# Patient Record
Sex: Female | Born: 1955
Health system: Southern US, Community
[De-identification: ages and names within clinical notes are randomized; demographics above are authoritative.]

## PROBLEM LIST (undated history)

## (undated) DIAGNOSIS — M41129 Adolescent idiopathic scoliosis, site unspecified: Secondary | ICD-10-CM

## (undated) DIAGNOSIS — Z808 Family history of malignant neoplasm of other organs or systems: Secondary | ICD-10-CM

## (undated) DIAGNOSIS — E0590 Thyrotoxicosis, unspecified without thyrotoxic crisis or storm: Secondary | ICD-10-CM

## (undated) DIAGNOSIS — Z9221 Personal history of antineoplastic chemotherapy: Secondary | ICD-10-CM

## (undated) DIAGNOSIS — I10 Essential (primary) hypertension: Secondary | ICD-10-CM

## (undated) DIAGNOSIS — L570 Actinic keratosis: Secondary | ICD-10-CM

## (undated) DIAGNOSIS — D649 Anemia, unspecified: Secondary | ICD-10-CM

## (undated) DIAGNOSIS — Z923 Personal history of irradiation: Secondary | ICD-10-CM

## (undated) DIAGNOSIS — E785 Hyperlipidemia, unspecified: Secondary | ICD-10-CM

## (undated) DIAGNOSIS — Z8051 Family history of malignant neoplasm of kidney: Secondary | ICD-10-CM

## (undated) DIAGNOSIS — E538 Deficiency of other specified B group vitamins: Secondary | ICD-10-CM

## (undated) DIAGNOSIS — C50919 Malignant neoplasm of unspecified site of unspecified female breast: Secondary | ICD-10-CM

## (undated) HISTORY — DX: Actinic keratosis: L57.0

## (undated) HISTORY — DX: Family history of malignant neoplasm of other organs or systems: Z80.8

## (undated) HISTORY — PX: APPENDECTOMY: SHX54

## (undated) HISTORY — PX: OTHER SURGICAL HISTORY: SHX169

## (undated) HISTORY — DX: Family history of malignant neoplasm of kidney: Z80.51

## (undated) HISTORY — PX: BREAST CYST ASPIRATION: SHX578

---

## 2005-01-07 ENCOUNTER — Ambulatory Visit: Payer: Self-pay | Admitting: Unknown Physician Specialty

## 2005-01-15 ENCOUNTER — Ambulatory Visit: Payer: Self-pay | Admitting: Gastroenterology

## 2006-01-12 ENCOUNTER — Ambulatory Visit: Payer: Self-pay | Admitting: Unknown Physician Specialty

## 2007-01-18 ENCOUNTER — Ambulatory Visit: Payer: Self-pay | Admitting: Unknown Physician Specialty

## 2008-02-14 ENCOUNTER — Ambulatory Visit: Payer: Self-pay | Admitting: Unknown Physician Specialty

## 2008-04-19 ENCOUNTER — Ambulatory Visit: Payer: Self-pay | Admitting: Gastroenterology

## 2009-02-17 ENCOUNTER — Ambulatory Visit: Payer: Self-pay | Admitting: Unknown Physician Specialty

## 2009-09-13 DIAGNOSIS — C50919 Malignant neoplasm of unspecified site of unspecified female breast: Secondary | ICD-10-CM

## 2009-09-13 HISTORY — PX: BREAST BIOPSY: SHX20

## 2009-09-13 HISTORY — PX: BREAST LUMPECTOMY: SHX2

## 2009-09-13 HISTORY — DX: Malignant neoplasm of unspecified site of unspecified female breast: C50.919

## 2010-02-18 ENCOUNTER — Ambulatory Visit: Payer: Self-pay | Admitting: Unknown Physician Specialty

## 2010-06-09 ENCOUNTER — Ambulatory Visit: Payer: Self-pay | Admitting: Unknown Physician Specialty

## 2010-07-07 ENCOUNTER — Ambulatory Visit: Payer: Self-pay | Admitting: Surgery

## 2010-07-15 ENCOUNTER — Ambulatory Visit: Payer: Self-pay | Admitting: Surgery

## 2010-07-29 ENCOUNTER — Ambulatory Visit: Payer: Self-pay | Admitting: Internal Medicine

## 2010-07-29 LAB — PATHOLOGY REPORT

## 2010-08-05 ENCOUNTER — Ambulatory Visit: Payer: Self-pay | Admitting: Internal Medicine

## 2010-08-07 LAB — CANCER ANTIGEN 27.29: CA 27.29: 25.9 U/mL (ref 0.0–38.6)

## 2010-08-11 ENCOUNTER — Ambulatory Visit: Payer: Self-pay | Admitting: Internal Medicine

## 2010-08-13 ENCOUNTER — Ambulatory Visit: Payer: Self-pay | Admitting: Internal Medicine

## 2010-08-18 ENCOUNTER — Ambulatory Visit: Payer: Self-pay | Admitting: Surgery

## 2010-09-13 ENCOUNTER — Ambulatory Visit: Payer: Self-pay | Admitting: Internal Medicine

## 2010-10-14 ENCOUNTER — Ambulatory Visit: Payer: Self-pay | Admitting: Internal Medicine

## 2010-11-12 ENCOUNTER — Ambulatory Visit: Payer: Self-pay | Admitting: Internal Medicine

## 2010-12-13 ENCOUNTER — Ambulatory Visit: Payer: Self-pay | Admitting: Internal Medicine

## 2010-12-19 ENCOUNTER — Emergency Department: Payer: Self-pay | Admitting: Emergency Medicine

## 2011-01-09 LAB — CANCER ANTIGEN 27.29: CA 27.29: 24.6 U/mL (ref 0.0–38.6)

## 2011-01-12 ENCOUNTER — Ambulatory Visit: Payer: Self-pay | Admitting: Internal Medicine

## 2011-02-12 ENCOUNTER — Ambulatory Visit: Payer: Self-pay | Admitting: Internal Medicine

## 2011-03-14 ENCOUNTER — Ambulatory Visit: Payer: Self-pay | Admitting: Internal Medicine

## 2011-04-14 ENCOUNTER — Ambulatory Visit: Payer: Self-pay | Admitting: Internal Medicine

## 2011-05-15 ENCOUNTER — Ambulatory Visit: Payer: Self-pay | Admitting: Internal Medicine

## 2011-05-20 LAB — CANCER ANTIGEN 27.29: CA 27.29: 23.3 U/mL (ref 0.0–38.6)

## 2011-06-04 ENCOUNTER — Ambulatory Visit: Payer: Self-pay | Admitting: Gastroenterology

## 2011-06-14 ENCOUNTER — Ambulatory Visit: Payer: Self-pay | Admitting: Internal Medicine

## 2011-07-20 ENCOUNTER — Ambulatory Visit: Payer: Self-pay | Admitting: Internal Medicine

## 2011-08-14 ENCOUNTER — Ambulatory Visit: Payer: Self-pay | Admitting: Internal Medicine

## 2011-09-14 ENCOUNTER — Ambulatory Visit: Payer: Self-pay | Admitting: Internal Medicine

## 2011-10-15 ENCOUNTER — Ambulatory Visit: Payer: Self-pay | Admitting: Internal Medicine

## 2011-11-17 ENCOUNTER — Ambulatory Visit: Payer: Self-pay | Admitting: Oncology

## 2011-12-13 ENCOUNTER — Ambulatory Visit: Payer: Self-pay | Admitting: Oncology

## 2012-01-12 ENCOUNTER — Ambulatory Visit: Payer: Self-pay | Admitting: Oncology

## 2012-02-16 ENCOUNTER — Ambulatory Visit: Payer: Self-pay | Admitting: Oncology

## 2012-02-16 LAB — COMPREHENSIVE METABOLIC PANEL
BUN: 12 mg/dL (ref 7–18)
Bilirubin,Total: 0.5 mg/dL (ref 0.2–1.0)
Calcium, Total: 9.4 mg/dL (ref 8.5–10.1)
Creatinine: 0.6 mg/dL (ref 0.60–1.30)
EGFR (African American): >60
Glucose: 93 mg/dL (ref 65–99)
Potassium: 3.7 mmol/L (ref 3.5–5.1)
SGOT(AST): 20 U/L (ref 15–37)
Sodium: 141 mmol/L (ref 136–145)

## 2012-02-16 LAB — CBC CANCER CENTER
Basophil #: 0 x10 3/mm (ref 0.0–0.1)
Eosinophil #: 0.1 x10 3/mm (ref 0.0–0.7)
Eosinophil %: 1.6 %
HGB: 12.3 g/dL (ref 12.0–16.0)
Lymphocyte %: 39.6 %
MCV: 83 fL (ref 80–100)
Monocyte #: 0.4 x10 3/mm (ref 0.2–0.9)
Monocyte %: 7.7 %
Neutrophil #: 2.8 x10 3/mm (ref 1.4–6.5)
Neutrophil %: 51 %
RBC: 4.35 10*6/uL (ref 3.80–5.20)
RDW: 13 % (ref 11.5–14.5)
WBC: 5.4 x10 3/mm (ref 3.6–11.0)

## 2012-02-17 LAB — CANCER ANTIGEN 27.29: CA 27.29: 28.4 U/mL (ref 0.0–38.6)

## 2012-03-13 ENCOUNTER — Ambulatory Visit: Payer: Self-pay | Admitting: Oncology

## 2012-05-02 ENCOUNTER — Ambulatory Visit: Payer: Self-pay | Admitting: Oncology

## 2012-05-08 ENCOUNTER — Ambulatory Visit: Payer: Self-pay | Admitting: Oncology

## 2012-05-14 ENCOUNTER — Ambulatory Visit: Payer: Self-pay | Admitting: Oncology

## 2012-06-13 ENCOUNTER — Ambulatory Visit: Payer: Self-pay | Admitting: Oncology

## 2012-07-14 ENCOUNTER — Ambulatory Visit: Payer: Self-pay | Admitting: Oncology

## 2012-08-02 ENCOUNTER — Ambulatory Visit: Payer: Self-pay | Admitting: Unknown Physician Specialty

## 2012-08-13 ENCOUNTER — Ambulatory Visit: Payer: Self-pay | Admitting: Oncology

## 2012-08-23 LAB — CBC CANCER CENTER
Basophil %: 0.2 %
Eosinophil #: 0.2 x10 3/mm (ref 0.0–0.7)
Eosinophil %: 2.6 %
HCT: 37.6 % (ref 35.0–47.0)
HGB: 12.9 g/dL (ref 12.0–16.0)
Lymphocyte %: 33.1 %
MCH: 28.4 pg (ref 26.0–34.0)
MCHC: 34.2 g/dL (ref 32.0–36.0)
Monocyte #: 0.4 x10 3/mm (ref 0.2–0.9)
Monocyte %: 6.5 %
Neutrophil #: 3.4 x10 3/mm (ref 1.4–6.5)
Neutrophil %: 57.6 %
RBC: 4.53 10*6/uL (ref 3.80–5.20)
WBC: 5.9 x10 3/mm (ref 3.6–11.0)

## 2012-08-23 LAB — COMPREHENSIVE METABOLIC PANEL
Albumin: 4.1 g/dL (ref 3.4–5.0)
Alkaline Phosphatase: 124 U/L (ref 50–136)
Bilirubin,Total: 0.4 mg/dL (ref 0.2–1.0)
Calcium, Total: 9.6 mg/dL (ref 8.5–10.1)
Chloride: 103 mmol/L (ref 98–107)
Co2: 28 mmol/L (ref 21–32)
EGFR (African American): >60
EGFR (Non-African Amer.): >60
Osmolality: 285 (ref 275–301)
SGOT(AST): 27 U/L (ref 15–37)
SGPT (ALT): 43 U/L (ref 12–78)
Sodium: 141 mmol/L (ref 136–145)

## 2012-08-24 LAB — CANCER ANTIGEN 27.29: CA 27.29: 22.2 U/mL (ref 0.0–38.6)

## 2012-09-13 ENCOUNTER — Ambulatory Visit: Payer: Self-pay | Admitting: Oncology

## 2012-09-22 ENCOUNTER — Other Ambulatory Visit: Payer: Self-pay | Admitting: Oncology

## 2012-09-22 DIAGNOSIS — Z853 Personal history of malignant neoplasm of breast: Secondary | ICD-10-CM

## 2012-09-29 ENCOUNTER — Ambulatory Visit
Admission: RE | Admit: 2012-09-29 | Discharge: 2012-09-29 | Disposition: A | Discharge status: Home / Self Care | Payer: BC Managed Care – PPO | Source: Ambulatory Visit | Attending: Oncology | Admitting: Oncology

## 2012-09-29 DIAGNOSIS — Z853 Personal history of malignant neoplasm of breast: Secondary | ICD-10-CM

## 2012-09-29 MED ORDER — GADOBENATE DIMEGLUMINE 529 MG/ML IV SOLN
14.0000 mL | Freq: Once | INTRAVENOUS | Status: AC | PRN
  Administered 2012-09-29: 14 mL via INTRAVENOUS

## 2012-10-14 ENCOUNTER — Ambulatory Visit: Payer: Self-pay | Admitting: Oncology

## 2012-11-11 ENCOUNTER — Ambulatory Visit: Payer: Self-pay | Admitting: Oncology

## 2012-12-12 ENCOUNTER — Ambulatory Visit: Payer: Self-pay | Admitting: Oncology

## 2013-02-21 ENCOUNTER — Ambulatory Visit: Payer: Self-pay | Admitting: Oncology

## 2013-02-21 LAB — COMPREHENSIVE METABOLIC PANEL
Albumin: 3.9 g/dL (ref 3.4–5.0)
Alkaline Phosphatase: 124 U/L (ref 50–136)
Anion Gap: 7 (ref 7–16)
BUN: 15 mg/dL (ref 7–18)
Bilirubin,Total: 0.5 mg/dL (ref 0.2–1.0)
Chloride: 105 mmol/L (ref 98–107)
Co2: 27 mmol/L (ref 21–32)
EGFR (African American): >60
Osmolality: 279 (ref 275–301)
Potassium: 3.5 mmol/L (ref 3.5–5.1)
SGOT(AST): 20 U/L (ref 15–37)
SGPT (ALT): 24 U/L (ref 12–78)
Sodium: 139 mmol/L (ref 136–145)

## 2013-02-21 LAB — CBC CANCER CENTER
HCT: 35.7 % (ref 35.0–47.0)
HGB: 12.4 g/dL (ref 12.0–16.0)
Lymphocyte %: 35.4 %
MCH: 28.1 pg (ref 26.0–34.0)
MCHC: 34.8 g/dL (ref 32.0–36.0)
MCV: 81 fL (ref 80–100)
Monocyte #: 0.4 x10 3/mm (ref 0.2–0.9)
Monocyte %: 5.7 %
Neutrophil %: 56 %
Platelet: 209 x10 3/mm (ref 150–440)
RBC: 4.44 10*6/uL (ref 3.80–5.20)

## 2013-02-22 LAB — CANCER ANTIGEN 27.29: CA 27.29: 19.3 U/mL (ref 0.0–38.6)

## 2013-03-13 ENCOUNTER — Ambulatory Visit: Payer: Self-pay | Admitting: Oncology

## 2013-05-09 ENCOUNTER — Ambulatory Visit: Payer: Self-pay | Admitting: Oncology

## 2013-08-22 ENCOUNTER — Ambulatory Visit: Payer: Self-pay | Admitting: Oncology

## 2013-08-22 LAB — CBC CANCER CENTER
Basophil #: 0 x10 3/mm (ref 0.0–0.1)
Basophil %: 0.2 %
Eosinophil #: 0.1 x10 3/mm (ref 0.0–0.7)
HCT: 36.9 % (ref 35.0–47.0)
HGB: 12.5 g/dL (ref 12.0–16.0)
Lymphocyte #: 2.3 x10 3/mm (ref 1.0–3.6)
Lymphocyte %: 40.8 %
MCV: 82 fL (ref 80–100)
Monocyte #: 0.4 x10 3/mm (ref 0.2–0.9)
Monocyte %: 7.3 %
Neutrophil #: 2.8 x10 3/mm (ref 1.4–6.5)
Neutrophil %: 49.1 %
Platelet: 221 x10 3/mm (ref 150–440)
RDW: 12.9 % (ref 11.5–14.5)
WBC: 5.6 x10 3/mm (ref 3.6–11.0)

## 2013-08-22 LAB — COMPREHENSIVE METABOLIC PANEL
Albumin: 3.9 g/dL (ref 3.4–5.0)
Alkaline Phosphatase: 128 U/L — ABNORMAL HIGH
Anion Gap: 7 (ref 7–16)
BUN: 16 mg/dL (ref 7–18)
Bilirubin,Total: 0.4 mg/dL (ref 0.2–1.0)
Calcium, Total: 9.2 mg/dL (ref 8.5–10.1)
Chloride: 102 mmol/L (ref 98–107)
Co2: 30 mmol/L (ref 21–32)
EGFR (Non-African Amer.): >60
Glucose: 92 mg/dL (ref 65–99)
Osmolality: 278 (ref 275–301)
Potassium: 3.8 mmol/L (ref 3.5–5.1)
SGOT(AST): 24 U/L (ref 15–37)
Sodium: 139 mmol/L (ref 136–145)
Total Protein: 7.2 g/dL (ref 6.4–8.2)

## 2013-08-23 LAB — CANCER ANTIGEN 27.29: CA 27.29: 21.5 U/mL (ref 0.0–38.6)

## 2013-09-13 ENCOUNTER — Ambulatory Visit: Payer: Self-pay | Admitting: Oncology

## 2014-02-20 ENCOUNTER — Ambulatory Visit: Payer: Self-pay | Admitting: Oncology

## 2014-02-20 LAB — CBC CANCER CENTER
BASOS ABS: 0 x10 3/mm (ref 0.0–0.1)
Basophil %: 0 %
EOS ABS: 0.2 x10 3/mm (ref 0.0–0.7)
Eosinophil %: 2.3 %
HCT: 38 % (ref 35.0–47.0)
HGB: 13.1 g/dL (ref 12.0–16.0)
LYMPHS PCT: 41.9 %
Lymphocyte #: 2.8 x10 3/mm (ref 1.0–3.6)
MCH: 28.2 pg (ref 26.0–34.0)
MCHC: 34.6 g/dL (ref 32.0–36.0)
MCV: 82 fL (ref 80–100)
MONOS PCT: 5.6 %
Monocyte #: 0.4 x10 3/mm (ref 0.2–0.9)
Neutrophil #: 3.3 x10 3/mm (ref 1.4–6.5)
Neutrophil %: 50.2 %
Platelet: 242 x10 3/mm (ref 150–440)
RBC: 4.66 10*6/uL (ref 3.80–5.20)
RDW: 13.2 % (ref 11.5–14.5)
WBC: 6.7 x10 3/mm (ref 3.6–11.0)

## 2014-02-20 LAB — COMPREHENSIVE METABOLIC PANEL
Albumin: 4.1 g/dL (ref 3.4–5.0)
Alkaline Phosphatase: 125 U/L — ABNORMAL HIGH
Anion Gap: 6 — ABNORMAL LOW (ref 7–16)
BUN: 16 mg/dL (ref 7–18)
Bilirubin,Total: 0.5 mg/dL (ref 0.2–1.0)
Calcium, Total: 9.8 mg/dL (ref 8.5–10.1)
Chloride: 104 mmol/L (ref 98–107)
Co2: 31 mmol/L (ref 21–32)
Creatinine: 0.74 mg/dL (ref 0.60–1.30)
EGFR (African American): >60
EGFR (Non-African Amer.): >60
Glucose: 109 mg/dL — ABNORMAL HIGH (ref 65–99)
Osmolality: 283 (ref 275–301)
Potassium: 4.2 mmol/L (ref 3.5–5.1)
SGOT(AST): 22 U/L (ref 15–37)
SGPT (ALT): 24 U/L (ref 12–78)
Sodium: 141 mmol/L (ref 136–145)
Total Protein: 7.5 g/dL (ref 6.4–8.2)

## 2014-02-21 LAB — CANCER ANTIGEN 27.29: CA 27.29: 21.8 U/mL (ref 0.0–38.6)

## 2014-03-13 ENCOUNTER — Ambulatory Visit: Payer: Self-pay | Admitting: Oncology

## 2014-05-10 ENCOUNTER — Ambulatory Visit: Payer: Self-pay | Admitting: Oncology

## 2014-08-28 ENCOUNTER — Ambulatory Visit: Payer: Self-pay | Admitting: Oncology

## 2014-08-28 LAB — COMPREHENSIVE METABOLIC PANEL
ALT: 29 U/L
Albumin: 4 g/dL (ref 3.4–5.0)
Alkaline Phosphatase: 116 U/L
Anion Gap: 4 — ABNORMAL LOW (ref 7–16)
BUN: 16 mg/dL (ref 7–18)
Bilirubin,Total: 0.4 mg/dL (ref 0.2–1.0)
Calcium, Total: 9.3 mg/dL (ref 8.5–10.1)
Chloride: 104 mmol/L (ref 98–107)
Co2: 32 mmol/L (ref 21–32)
Creatinine: 0.66 mg/dL (ref 0.60–1.30)
EGFR (African American): >60
Glucose: 99 mg/dL (ref 65–99)
Osmolality: 281 (ref 275–301)
POTASSIUM: 3.6 mmol/L (ref 3.5–5.1)
SGOT(AST): 20 U/L (ref 15–37)
Sodium: 140 mmol/L (ref 136–145)
Total Protein: 7.2 g/dL (ref 6.4–8.2)

## 2014-08-28 LAB — CBC CANCER CENTER
BASOS PCT: 0.1 %
Basophil #: 0 x10 3/mm (ref 0.0–0.1)
Eosinophil #: 0.1 x10 3/mm (ref 0.0–0.7)
Eosinophil %: 1.8 %
HCT: 37.5 % (ref 35.0–47.0)
HGB: 12.6 g/dL (ref 12.0–16.0)
Lymphocyte #: 2.5 x10 3/mm (ref 1.0–3.6)
Lymphocyte %: 40.8 %
MCH: 27.4 pg (ref 26.0–34.0)
MCHC: 33.7 g/dL (ref 32.0–36.0)
MCV: 81 fL (ref 80–100)
MONO ABS: 0.4 x10 3/mm (ref 0.2–0.9)
MONOS PCT: 6.1 %
NEUTROS PCT: 51.2 %
Neutrophil #: 3.1 x10 3/mm (ref 1.4–6.5)
Platelet: 223 x10 3/mm (ref 150–440)
RBC: 4.61 10*6/uL (ref 3.80–5.20)
RDW: 13.2 % (ref 11.5–14.5)
WBC: 6 x10 3/mm (ref 3.6–11.0)

## 2014-09-13 ENCOUNTER — Ambulatory Visit: Payer: Self-pay | Admitting: Oncology

## 2014-12-27 ENCOUNTER — Other Ambulatory Visit: Payer: Self-pay | Admitting: Oncology

## 2014-12-27 DIAGNOSIS — Z853 Personal history of malignant neoplasm of breast: Secondary | ICD-10-CM

## 2015-01-06 DIAGNOSIS — E782 Mixed hyperlipidemia: Secondary | ICD-10-CM | POA: Insufficient documentation

## 2015-05-12 ENCOUNTER — Ambulatory Visit
Admission: RE | Admit: 2015-05-12 | Discharge: 2015-05-12 | Disposition: A | Discharge status: Home / Self Care | Payer: BLUE CROSS/BLUE SHIELD | Source: Ambulatory Visit | Attending: Oncology | Admitting: Oncology

## 2015-05-12 ENCOUNTER — Ambulatory Visit: Payer: Self-pay

## 2015-05-12 DIAGNOSIS — Z853 Personal history of malignant neoplasm of breast: Secondary | ICD-10-CM | POA: Insufficient documentation

## 2015-05-12 HISTORY — DX: Malignant neoplasm of unspecified site of unspecified female breast: C50.919

## 2015-05-28 DIAGNOSIS — M41129 Adolescent idiopathic scoliosis, site unspecified: Secondary | ICD-10-CM | POA: Insufficient documentation

## 2015-07-24 ENCOUNTER — Emergency Department
Admission: EM | Admit: 2015-07-24 | Discharge: 2015-07-24 | Disposition: A | Discharge status: Home / Self Care | Payer: BLUE CROSS/BLUE SHIELD | Attending: Emergency Medicine | Admitting: Emergency Medicine

## 2015-07-24 DIAGNOSIS — I10 Essential (primary) hypertension: Secondary | ICD-10-CM | POA: Diagnosis not present

## 2015-07-24 HISTORY — DX: Essential (primary) hypertension: I10

## 2015-07-24 LAB — BASIC METABOLIC PANEL
Anion gap: 9 (ref 5–15)
BUN: 17 mg/dL (ref 6–20)
CHLORIDE: 104 mmol/L (ref 101–111)
CO2: 26 mmol/L (ref 22–32)
CREATININE: 0.71 mg/dL (ref 0.44–1.00)
Calcium: 9.9 mg/dL (ref 8.9–10.3)
GFR calc non Af Amer: >60 mL/min (ref >60)
GLUCOSE: 99 mg/dL (ref 65–99)
Potassium: 3.8 mmol/L (ref 3.5–5.1)
Sodium: 139 mmol/L (ref 135–145)

## 2015-07-24 LAB — CBC WITH DIFFERENTIAL/PLATELET
Basophils Absolute: 0 10*3/uL (ref 0–0.1)
Basophils Relative: 0 %
EOS ABS: 0.1 10*3/uL (ref 0–0.7)
Eosinophils Relative: 2 %
HEMATOCRIT: 39.4 % (ref 35.0–47.0)
HEMOGLOBIN: 13.3 g/dL (ref 12.0–16.0)
LYMPHS ABS: 3 10*3/uL (ref 1.0–3.6)
Lymphocytes Relative: 41 %
MCH: 27.5 pg (ref 26.0–34.0)
MCHC: 33.7 g/dL (ref 32.0–36.0)
MCV: 81.6 fL (ref 80.0–100.0)
MONO ABS: 0.5 10*3/uL (ref 0.2–0.9)
MONOS PCT: 7 %
NEUTROS PCT: 50 %
Neutro Abs: 3.6 10*3/uL (ref 1.4–6.5)
Platelets: 249 10*3/uL (ref 150–440)
RBC: 4.83 MIL/uL (ref 3.80–5.20)
RDW: 13.4 % (ref 11.5–14.5)
WBC: 7.3 10*3/uL (ref 3.6–11.0)

## 2015-07-24 MED ORDER — CLONIDINE HCL 0.1 MG PO TABS
0.1000 mg | ORAL_TABLET | Freq: Once | ORAL | Status: AC
  Administered 2015-07-24: 0.1 mg via ORAL
  Filled 2015-07-24: qty 1

## 2015-07-24 MED ORDER — CLONIDINE HCL 0.1 MG PO TABS: 0.1000 mg | ORAL_TABLET | Freq: Every day | ORAL | Status: DC

## 2015-07-24 NOTE — ED Provider Notes (Signed)
[facility] Emergency Department Provider Note  ____________________________________________  Time seen: Approximately 4:57 PM  I have reviewed the triage vital signs and the nursing notes.   HISTORY  Chief Complaint Hypertension    HPI Kelly Green is a 59 y.o. female presents for evaluation of elevated blood pressure. Patient states that she stopped by the fire department and her blood pressure was noted to be 202/118. Repeat blood pressure upon arrival was 166/119. Patient reports having a mild headache, denies any chest pains nausea or vomiting. Denies any visual changes.   Past Medical History  Diagnosis Date  . Breast cancer ([place]) 2011    left breast with lumpectomy, chemo and rad tx  . Hypertension     There are no active problems to display for this patient.   Past Surgical History  Procedure Laterality Date  . Breast cyst aspiration Bilateral     neg    No current outpatient prescriptions on file.  Allergies Septra  No family history on file.  Social History Social History  Substance Use Topics  . Smoking status: Never Smoker   . Smokeless tobacco: None  . Alcohol Use: No    Review of Systems Constitutional: No fever/chills Eyes: No visual changes. ENT: No sore throat. Cardiovascular: Denies chest pain. Respiratory: Denies shortness of breath. Gastrointestinal: No abdominal pain.  No nausea, no vomiting.  No diarrhea.  No constipation. Genitourinary: Negative for dysuria. Musculoskeletal: Negative for back pain. Skin: Negative for rash. Neurological: Negative for headaches, focal weakness or numbness.  10-point ROS otherwise negative.  ____________________________________________   PHYSICAL EXAM:  VITAL SIGNS: ED Triage Vitals  Enc Vitals Group     BP 07/24/15 1644 166/119 mmHg     Pulse Rate 07/24/15 1644 107     Resp 07/24/15 1644 16     Temp 07/24/15 1644 98.5 F (36.9 C)     Temp Source 07/24/15 1644  Oral     SpO2 07/24/15 1644 97 %     Weight 07/24/15 1644 154 lb (69.854 kg)     Height 07/24/15 1644 5\' 1"  (1.549 m)     Head Cir --      Peak Flow --      Pain Score --      Pain Loc --      Pain Edu? --      Excl. in [place]? --     Constitutional: Alert and oriented. Well appearing and in no acute distress. Eyes: Conjunctivae are normal. PERRL. EOMI. funduscopy normal. Head: Atraumatic. Nose: No congestion/rhinnorhea. Mouth/Throat: Mucous membranes are moist.  Oropharynx non-erythematous. Neck: No stridor.   Cardiovascular: Normal rate, regular rhythm. Grossly normal heart sounds.  Good peripheral circulation. Respiratory: Normal respiratory effort.  No retractions. Lungs CTAB. Gastrointestinal: Soft and nontender. No distention. No abdominal bruits. No CVA tenderness. Musculoskeletal: No lower extremity tenderness nor edema.  No joint effusions. Neurologic:  Normal speech and language. No gross focal neurologic deficits are appreciated. No gait instability. Skin:  Skin is warm, dry and intact. No rash noted. Psychiatric: Mood and affect are normal. Speech and behavior are normal.  ____________________________________________   LABS (all labs ordered are listed, but only abnormal results are displayed)  Labs Reviewed  BASIC METABOLIC PANEL  CBC WITH DIFFERENTIAL/PLATELET   ____________________________________________    PROCEDURES  Procedure(s) performed: None  Critical Care performed: No  ____________________________________________   INITIAL IMPRESSION / ASSESSMENT AND PLAN / ED COURSE  Pertinent labs & imaging results that were available  during my care of the patient were reviewed by me and considered in my medical decision making (see chart for details).  Hypertension chronic. Elevated today and has been elevated for the last few days. Clonidine 0.1 mg given upon arrival which seems to lower the blood pressure. Renal function and liver function tests normal CBC  normal. Patient follow-up with PCP next week for continued monitoring turning of her blood pressure. Patient voices no other emergency medical complaints at this time and will return to the ER with any worsening symptomology. ____________________________________________   FINAL CLINICAL IMPRESSION(S) / ED DIAGNOSES  Final diagnoses:  None      Arlyss Repress, PA-C 07/24/15 [place], MD 07/24/15 [phone]

## 2015-07-24 NOTE — ED Notes (Signed)
Pt reports blood pressure was 202/118 today at 3pm. Was told by PCP to come to ED. Takes losartan daily, has not missed dose. Reports mild headache

## 2015-07-24 NOTE — Discharge Instructions (Signed)
Hypertension Hypertension, commonly called high blood pressure, is when the force of blood pumping through your arteries is too strong. Your arteries are the blood vessels that carry blood from your heart throughout your body. A blood pressure reading consists of a higher number over a lower number, such as 110/72. The higher number (systolic) is the pressure inside your arteries when your heart pumps. The lower number (diastolic) is the pressure inside your arteries when your heart relaxes. Ideally you want your blood pressure below 120/80. Hypertension forces your heart to work harder to pump blood. Your arteries may become narrow or stiff. Having untreated or uncontrolled hypertension can cause heart attack, stroke, kidney disease, and other problems. RISK FACTORS Some risk factors for high blood pressure are controllable. Others are not.  Risk factors you cannot control include:   Race. You may be at higher risk if you are African American.  Age. Risk increases with age.  Gender. Men are at higher risk than women before age 45 years. After age 65, women are at higher risk than men. Risk factors you can control include:  Not getting enough exercise or physical activity.  Being overweight.  Getting too much fat, sugar, calories, or salt in your diet.  Drinking too much alcohol. SIGNS AND SYMPTOMS Hypertension does not usually cause signs or symptoms. Extremely high blood pressure (hypertensive crisis) may cause headache, anxiety, shortness of breath, and nosebleed. DIAGNOSIS To check if you have hypertension, your health care provider will measure your blood pressure while you are seated, with your arm held at the level of your heart. It should be measured at least twice using the same arm. Certain conditions can cause a difference in blood pressure between your right and left arms. A blood pressure reading that is higher than normal on one occasion does not mean that you need treatment. If  it is not clear whether you have high blood pressure, you may be asked to return on a different day to have your blood pressure checked again. Or, you may be asked to monitor your blood pressure at home for 1 or more weeks. TREATMENT Treating high blood pressure includes making lifestyle changes and possibly taking medicine. Living a healthy lifestyle can help lower high blood pressure. You may need to change some of your habits. Lifestyle changes may include:  Following the DASH diet. This diet is high in fruits, vegetables, and whole grains. It is low in salt, red meat, and added sugars.  Keep your sodium intake below 2,300 mg per day.  Getting at least 30-45 minutes of aerobic exercise at least 4 times per week.  Losing weight if necessary.  Not smoking.  Limiting alcoholic beverages.  Learning ways to reduce stress. Your health care provider may prescribe medicine if lifestyle changes are not enough to get your blood pressure under control, and if one of the following is true:  You are 18-59 years of age and your systolic blood pressure is above 140.  You are 60 years of age or older, and your systolic blood pressure is above 150.  Your diastolic blood pressure is above 90.  You have diabetes, and your systolic blood pressure is over 140 or your diastolic blood pressure is over 90.  You have kidney disease and your blood pressure is above 140/90.  You have heart disease and your blood pressure is above 140/90. Your personal target blood pressure may vary depending on your medical conditions, your age, and other factors. HOME CARE INSTRUCTIONS    Have your blood pressure rechecked as directed by your health care provider.   Take medicines only as directed by your health care provider. Follow the directions carefully. Blood pressure medicines must be taken as prescribed. The medicine does not work as well when you skip doses. Skipping doses also puts you at risk for  problems.  Do not smoke.   Monitor your blood pressure at home as directed by your health care provider. SEEK MEDICAL CARE IF:   You think you are having a reaction to medicines taken.  You have recurrent headaches or feel dizzy.  You have swelling in your ankles.  You have trouble with your vision. SEEK IMMEDIATE MEDICAL CARE IF:  You develop a severe headache or confusion.  You have unusual weakness, numbness, or feel faint.  You have severe chest or abdominal pain.  You vomit repeatedly.  You have trouble breathing. MAKE SURE YOU:   Understand these instructions.  Will watch your condition.  Will get help right away if you are not doing well or get worse.   This information is not intended to replace advice given to you by your health care provider. Make sure you discuss any questions you have with your health care provider.   Document Released: 08/30/2005 Document Revised: 01/14/2015 Document Reviewed: 06/22/2013 Elsevier Interactive Patient Education 2016 Elsevier Inc.  

## 2015-08-28 ENCOUNTER — Other Ambulatory Visit: Payer: Self-pay | Admitting: *Deleted

## 2015-08-28 DIAGNOSIS — C50919 Malignant neoplasm of unspecified site of unspecified female breast: Secondary | ICD-10-CM

## 2015-09-03 ENCOUNTER — Inpatient Hospital Stay (HOSPITAL_BASED_OUTPATIENT_CLINIC_OR_DEPARTMENT_OTHER): Payer: BLUE CROSS/BLUE SHIELD | Admitting: Oncology

## 2015-09-03 ENCOUNTER — Encounter: Payer: Self-pay | Admitting: Oncology

## 2015-09-03 ENCOUNTER — Inpatient Hospital Stay: Payer: BLUE CROSS/BLUE SHIELD | Attending: Oncology

## 2015-09-03 VITALS — BP 143/96 | HR 101 | Temp 98.2°F | Resp 18 | Wt 158.7 lb

## 2015-09-03 DIAGNOSIS — Z923 Personal history of irradiation: Secondary | ICD-10-CM | POA: Diagnosis not present

## 2015-09-03 DIAGNOSIS — E7800 Pure hypercholesterolemia, unspecified: Secondary | ICD-10-CM | POA: Insufficient documentation

## 2015-09-03 DIAGNOSIS — Z171 Estrogen receptor negative status [ER-]: Secondary | ICD-10-CM | POA: Diagnosis not present

## 2015-09-03 DIAGNOSIS — Z853 Personal history of malignant neoplasm of breast: Secondary | ICD-10-CM | POA: Insufficient documentation

## 2015-09-03 DIAGNOSIS — I10 Essential (primary) hypertension: Secondary | ICD-10-CM | POA: Diagnosis not present

## 2015-09-03 DIAGNOSIS — G8929 Other chronic pain: Secondary | ICD-10-CM | POA: Diagnosis not present

## 2015-09-03 DIAGNOSIS — M545 Low back pain: Secondary | ICD-10-CM | POA: Diagnosis not present

## 2015-09-03 DIAGNOSIS — Z79899 Other long term (current) drug therapy: Secondary | ICD-10-CM | POA: Insufficient documentation

## 2015-09-03 DIAGNOSIS — D509 Iron deficiency anemia, unspecified: Secondary | ICD-10-CM | POA: Insufficient documentation

## 2015-09-03 DIAGNOSIS — C50919 Malignant neoplasm of unspecified site of unspecified female breast: Secondary | ICD-10-CM

## 2015-09-03 DIAGNOSIS — E538 Deficiency of other specified B group vitamins: Secondary | ICD-10-CM | POA: Insufficient documentation

## 2015-09-03 DIAGNOSIS — E0590 Thyrotoxicosis, unspecified without thyrotoxic crisis or storm: Secondary | ICD-10-CM | POA: Insufficient documentation

## 2015-09-03 LAB — CBC WITH DIFFERENTIAL/PLATELET
BASOS ABS: 0 10*3/uL (ref 0–0.1)
Basophils Relative: 0 %
Eosinophils Absolute: 0.1 10*3/uL (ref 0–0.7)
Eosinophils Relative: 1 %
HEMATOCRIT: 40.2 % (ref 35.0–47.0)
Hemoglobin: 13.7 g/dL (ref 12.0–16.0)
LYMPHS PCT: 39 %
Lymphs Abs: 2.5 10*3/uL (ref 1.0–3.6)
MCH: 27.6 pg (ref 26.0–34.0)
MCHC: 34 g/dL (ref 32.0–36.0)
MCV: 81.1 fL (ref 80.0–100.0)
Monocytes Absolute: 0.4 10*3/uL (ref 0.2–0.9)
Monocytes Relative: 6 %
NEUTROS ABS: 3.3 10*3/uL (ref 1.4–6.5)
Neutrophils Relative %: 54 %
PLATELETS: 254 10*3/uL (ref 150–440)
RBC: 4.96 MIL/uL (ref 3.80–5.20)
RDW: 13 % (ref 11.5–14.5)
WBC: 6.2 10*3/uL (ref 3.6–11.0)

## 2015-09-03 LAB — COMPREHENSIVE METABOLIC PANEL
ALT: 33 U/L (ref 14–54)
AST: 29 U/L (ref 15–41)
Albumin: 4.8 g/dL (ref 3.5–5.0)
Alkaline Phosphatase: 117 U/L (ref 38–126)
Anion gap: 9 (ref 5–15)
BILIRUBIN TOTAL: 0.9 mg/dL (ref 0.3–1.2)
BUN: 16 mg/dL (ref 6–20)
CHLORIDE: 99 mmol/L — AB (ref 101–111)
CO2: 28 mmol/L (ref 22–32)
CREATININE: 0.6 mg/dL (ref 0.44–1.00)
Calcium: 9.9 mg/dL (ref 8.9–10.3)
GFR calc Af Amer: >60 mL/min (ref >60)
Glucose, Bld: 118 mg/dL — ABNORMAL HIGH (ref 65–99)
Potassium: 4.1 mmol/L (ref 3.5–5.1)
Sodium: 136 mmol/L (ref 135–145)
Total Protein: 8.1 g/dL (ref 6.5–8.1)

## 2015-09-03 NOTE — Progress Notes (Signed)
Met with patient today to have her complete 60 month Medical Conditions and Lifestyle Questionnaire Up.  This is part of patient follow up on [facility]P B-47 research study.  Patient completed questionnaire and it has been faxed to [facility].  I thanked patient for her time.  I spent approximately 5 minutes with patient.

## 2015-09-03 NOTE — Progress Notes (Signed)
BRCA dated August 22, 2013  [place] Negative for BRCA1 and BRCA2  No mutation has been identified

## 2015-09-03 NOTE — Progress Notes (Signed)
Patient is having BP problems.  Seeing her PCP for this.

## 2015-09-03 NOTE — Progress Notes (Addendum)
[place] @ [facility] [phone]  Fax:([phone]) [place]: Oct 18, 1955  MR#: [id number]  JQB#:[id number]  Patient Care Team: Katheren Shams as PCP - General  CHIEF COMPLAINT:  Chief Complaint  Patient presents with  . Breast Cancer     No history exists.  Subjective: Chief Complaint/Diagnosis:   60 y.o. female pmh stage pII pT2 pN0 ( SN) M0  invasive ductal carcinoma of left breast , ER 0%, PR  1%, her2/neu not overexpressed who is s/p chemotherapy per protocol NSABP 47 Arm 1-A TC X 6 which she was randomized 08/27/10.  She has completed radiation to left breast per Dr. Baruch Green in June 2012 2.BRCA1 and BRCA2 mutations are not identified (December, 2014)   INTERVAL HISTORY: 59 year old lady came today further follow-up regarding carcinoma breast. She  has finished total 5 years of observation, Had a mammogram done in May 12, 2015 It was   BIRAD-2   sINCE LAST EVALUATION PATIENT DID NOT HAVE ANY SIGNIFICANT PROBLEM REVIEW OF SYSTEMS:   GENERAL:  Feels good.  Active.  No fevers, sweats or weight loss. PERFORMANCE STATUS (ECOG): 0 HEENT:  No visual changes, runny nose, sore throat, mouth sores or tenderness. Lungs: No shortness of breath or cough.  No hemoptysis. Cardiac:  No chest pain, palpitations, orthopnea, or PND. GI:  No nausea, vomiting, diarrhea, constipation, melena or hematochezia. GU:  No urgency, frequency, dysuria, or hematuria. Musculoskeletal:  No back pain.  No joint pain.  No muscle tenderness. Extremities:  No pain or swelling. Skin:  No rashes or skin changes. Neuro:  No headache, numbness or weakness, balance or coordination issues. Endocrine:  No diabetes, thyroid issues, hot flashes or night sweats. Psych:  No mood changes, depression or anxiety. Pain:  No focal pain. Review of systems:  All other systems reviewed and found to be negative. As per HPI. Otherwise, a complete review of systems is negatve.  PAST  MEDICAL HISTORY: Past Medical History  Diagnosis Date  . Breast cancer ([place]) 2011    left breast with lumpectomy, chemo and rad tx  . Hypertension     PAST SURGICAL HISTORY: Past Surgical History  Procedure Laterality Date  . Breast cyst aspiration Bilateral     neg   Significant History/PMH:   Oncology Protocol: Patient on research protocol:  NSABP B 47 Arm 1, Group 1A receiving Docetaxel 52m/m2 and Cyclophosphamide 6044mm2  IV every 3 weeks x 6 cycles.  She started treatment 08/27/2010.Research Nurse contact-Kelly Green   LBP and neck pain:    Left breast cancer:    anemia:    Hx migraine headaches:    hypertension:    appendectomy:    Partial thyroidectomy:   Preventive Screening:  Has patient had any of the following test? Mammography   Last Mammography: August 2014   Smoking History: Smoking History no quit 30 years ago.  ADVANCED DIRECTIVES:  No flowsheet data found.  HEALTH MAINTENANCE: Social History  Substance Use Topics  . Smoking status: Never Smoker   . Smokeless tobacco: None  . Alcohol Use: No      Allergies  Allergen Reactions  . Septra [Sulfamethoxazole-Trimethoprim] Rash    Current Outpatient Prescriptions  Medication Sig Dispense Refill  . amLODipine (NORVASC) 5 MG tablet Take by mouth.    . cyanocobalamin (,VITAMIN B-12,) 1000 MCG/ML injection Inject into the muscle.    . fluticasone (FLONASE) 50 MCG/ACT nasal spray as needed.     . Marland Kitchenosartan-hydrochlorothiazide (HYZAAR) 100-25 MG  tablet Take by mouth.    . pantoprazole (PROTONIX) 40 MG tablet Take by mouth.    . zolpidem (AMBIEN) 5 MG tablet Take by mouth.    . Cholecalciferol (VITAMIN D-1000 MAX ST) 1000 UNITS tablet Take by mouth.    . ferrous gluconate (FERGON) 240 (27 FE) MG tablet Take by mouth.    . Multiple Minerals-Vitamins (CALCIUM & VIT D3 BONE HEALTH PO) Take by mouth.    . Multiple Vitamin (MULTI-VITAMINS) TABS Take by mouth.    . Omega-3 Fatty Acids (FISH OIL)  1000 MG CAPS Take by mouth.     No current facility-administered medications for this visit.    OBJECTIVE:  Filed Vitals:   09/03/15 1438  BP: 143/96  Pulse: 101  Temp: 98.2 F (36.8 C)  Resp: 18     Body mass index is 30.01 kg/(m^2).    ECOG FS:0 - Asymptomatic  PHYSICAL EXAM: GENERAL:  Well developed, well nourished, sitting comfortably in the exam room in no acute distress. MENTAL STATUS:  Alert and oriented to person, place and time.  ENT:  Oropharynx clear without lesion.  Tongue normal. Mucous membranes moist.  RESPIRATORY:  Clear to auscultation without rales, wheezes or rhonchi. CARDIOVASCULAR:  Regular rate and rhythm without murmur, rub or gallop. BREAST:  Right breast without masses, skin changes or nipple discharge.  Left breast without masses, skin changes or nipple discharge. ABDOMEN:  Soft, non-tender, with active bowel sounds, and no hepatosplenomegaly.  No masses. BACK:  No CVA tenderness.  No tenderness on percussion of the back or rib cage. SKIN:  No rashes, ulcers or lesions. EXTREMITIES: No edema, no skin discoloration or tenderness.  No palpable cords. LYMPH NODES: No palpable cervical, supraclavicular, axillary or inguinal adenopathy  NEUROLOGICAL: Unremarkable. PSYCH:  Appropriate.    LAB RESULTS:  CBC Latest Ref Rng 09/03/2015 07/24/2015  WBC 3.6 - 11.0 K/uL 6.2 7.3  Hemoglobin 12.0 - 16.0 g/dL 13.7 13.3  Hematocrit 35.0 - 47.0 % 40.2 39.4  Platelets 150 - 440 K/uL 254 249    Appointment on 09/03/2015  Component Date Value Ref Range Status  . WBC 09/03/2015 6.2  3.6 - 11.0 K/uL Final  . RBC 09/03/2015 4.96  3.80 - 5.20 MIL/uL Final  . Hemoglobin 09/03/2015 13.7  12.0 - 16.0 g/dL Final  . HCT 09/03/2015 40.2  35.0 - 47.0 % Final  . MCV 09/03/2015 81.1  80.0 - 100.0 fL Final  . MCH 09/03/2015 27.6  26.0 - 34.0 pg Final  . MCHC 09/03/2015 34.0  32.0 - 36.0 g/dL Final  . RDW 09/03/2015 13.0  11.5 - 14.5 % Final  . Platelets 09/03/2015 254  150  - 440 K/uL Final  . Neutrophils Relative % 09/03/2015 54   Final  . Neutro Abs 09/03/2015 3.3  1.4 - 6.5 K/uL Final  . Lymphocytes Relative 09/03/2015 39   Final  . Lymphs Abs 09/03/2015 2.5  1.0 - 3.6 K/uL Final  . Monocytes Relative 09/03/2015 6   Final  . Monocytes Absolute 09/03/2015 0.4  0.2 - 0.9 K/uL Final  . Eosinophils Relative 09/03/2015 1   Final  . Eosinophils Absolute 09/03/2015 0.1  0 - 0.7 K/uL Final  . Basophils Relative 09/03/2015 0   Final  . Basophils Absolute 09/03/2015 0.0  0 - 0.1 K/uL Final  . Sodium 09/03/2015 136  135 - 145 mmol/L Final  . Potassium 09/03/2015 4.1  3.5 - 5.1 mmol/L Final  . Chloride 09/03/2015 99* 101 - 111 mmol/L  Final  . CO2 09/03/2015 28  22 - 32 mmol/L Final  . Glucose, Bld 09/03/2015 118* 65 - 99 mg/dL Final  . BUN 09/03/2015 16  6 - 20 mg/dL Final  . Creatinine, Ser 09/03/2015 0.60  0.44 - 1.00 mg/dL Final  . Calcium 09/03/2015 9.9  8.9 - 10.3 mg/dL Final  . Total Protein 09/03/2015 8.1  6.5 - 8.1 g/dL Final  . Albumin 09/03/2015 4.8  3.5 - 5.0 g/dL Final  . AST 09/03/2015 29  15 - 41 U/L Final  . ALT 09/03/2015 33  14 - 54 U/L Final  . Alkaline Phosphatase 09/03/2015 117  38 - 126 U/L Final  . Total Bilirubin 09/03/2015 0.9  0.3 - 1.2 mg/dL Final  . GFR calc non Af Amer 09/03/2015 >60  >60 mL/min Final  . GFR calc Af Amer 09/03/2015 >60  >60 mL/min Final   Comment: (NOTE) The eGFR has been calculated using the CKD EPI equation. This calculation has not been validated in all clinical situations. eGFR's persistently <60 mL/min signify possible Chronic Kidney Disease.   . Anion gap 09/03/2015 9  5 - 15 Final       STUDIES: Mammogram off August of 2016 has been reviewed independently ASSESSMENT: 1. Carcinoma of left breast.triple negative disease. On clinical ground there is no evidence of recurrent disease According to patient she had a BRCA mutation done and was reported to be negative  3. Chronic lower back  pain.stable  MEDICAL DECISION MAKING:  Review of mammogram Lab data In genetic mutation study report Scheduled mammogram in August of 2017   Discussion with research nurses as patient was on NSABP protocol Patient expressed understanding and was in agreement with this plan. She also understands that She can call clinic at any time with any questions, concerns, or complaints.    No matching staging information was found for the patient.  Forest Gleason, MD   09/03/2015 2:52 PM

## 2016-05-12 ENCOUNTER — Other Ambulatory Visit: Payer: Self-pay | Admitting: Oncology

## 2016-05-12 ENCOUNTER — Ambulatory Visit
Admission: RE | Admit: 2016-05-12 | Discharge: 2016-05-12 | Disposition: A | Discharge status: Home / Self Care | Payer: BLUE CROSS/BLUE SHIELD | Source: Ambulatory Visit | Attending: Oncology | Admitting: Oncology

## 2016-05-12 DIAGNOSIS — Z853 Personal history of malignant neoplasm of breast: Secondary | ICD-10-CM | POA: Diagnosis present

## 2016-09-01 ENCOUNTER — Inpatient Hospital Stay (HOSPITAL_BASED_OUTPATIENT_CLINIC_OR_DEPARTMENT_OTHER): Payer: BLUE CROSS/BLUE SHIELD

## 2016-09-01 ENCOUNTER — Other Ambulatory Visit: Payer: Self-pay

## 2016-09-01 ENCOUNTER — Inpatient Hospital Stay: Payer: BLUE CROSS/BLUE SHIELD | Attending: Internal Medicine | Admitting: Internal Medicine

## 2016-09-01 DIAGNOSIS — Z171 Estrogen receptor negative status [ER-]: Secondary | ICD-10-CM | POA: Insufficient documentation

## 2016-09-01 DIAGNOSIS — I10 Essential (primary) hypertension: Secondary | ICD-10-CM | POA: Insufficient documentation

## 2016-09-01 DIAGNOSIS — Z923 Personal history of irradiation: Secondary | ICD-10-CM | POA: Diagnosis not present

## 2016-09-01 DIAGNOSIS — Z9221 Personal history of antineoplastic chemotherapy: Secondary | ICD-10-CM | POA: Diagnosis not present

## 2016-09-01 DIAGNOSIS — C50812 Malignant neoplasm of overlapping sites of left female breast: Secondary | ICD-10-CM

## 2016-09-01 DIAGNOSIS — Z853 Personal history of malignant neoplasm of breast: Secondary | ICD-10-CM

## 2016-09-01 LAB — CBC WITH DIFFERENTIAL/PLATELET
BASOS ABS: 0 10*3/uL (ref 0–0.1)
Basophils Relative: 0 %
Eosinophils Absolute: 0.1 10*3/uL (ref 0–0.7)
Eosinophils Relative: 2 %
HCT: 38.3 % (ref 35.0–47.0)
HEMOGLOBIN: 13.4 g/dL (ref 12.0–16.0)
LYMPHS ABS: 3.4 10*3/uL (ref 1.0–3.6)
LYMPHS PCT: 45 %
MCH: 28 pg (ref 26.0–34.0)
MCHC: 35 g/dL (ref 32.0–36.0)
MCV: 79.9 fL — AB (ref 80.0–100.0)
Monocytes Absolute: 0.5 10*3/uL (ref 0.2–0.9)
Monocytes Relative: 6 %
NEUTROS ABS: 3.6 10*3/uL (ref 1.4–6.5)
NEUTROS PCT: 47 %
Platelets: 261 10*3/uL (ref 150–440)
RBC: 4.79 MIL/uL (ref 3.80–5.20)
RDW: 13 % (ref 11.5–14.5)
WBC: 7.6 10*3/uL (ref 3.6–11.0)

## 2016-09-01 LAB — COMPREHENSIVE METABOLIC PANEL
ALK PHOS: 122 U/L (ref 38–126)
ALT: 20 U/L (ref 14–54)
AST: 29 U/L (ref 15–41)
Albumin: 4.8 g/dL (ref 3.5–5.0)
Anion gap: 6 (ref 5–15)
BUN: 13 mg/dL (ref 6–20)
CALCIUM: 9.5 mg/dL (ref 8.9–10.3)
CO2: 27 mmol/L (ref 22–32)
CREATININE: 1.05 mg/dL — AB (ref 0.44–1.00)
Chloride: 102 mmol/L (ref 101–111)
GFR, EST NON AFRICAN AMERICAN: 56 mL/min — AB (ref >60)
Glucose, Bld: 100 mg/dL — ABNORMAL HIGH (ref 65–99)
Potassium: 3.6 mmol/L (ref 3.5–5.1)
Sodium: 135 mmol/L (ref 135–145)
Total Bilirubin: 0.6 mg/dL (ref 0.3–1.2)
Total Protein: 7.6 g/dL (ref 6.5–8.1)

## 2016-09-01 NOTE — Assessment & Plan Note (Addendum)
#  Stage II ER negative HER-2/neu negative left breast cancer status post chemotherapy [2012]; radiation. Recent mammogram August 2017 negative. Clinically no evidence of recurrence.  # right chest wall- clinically muscular pain.- If not improved recommend imaging/bone scan/CT scan  # mammo aug 2018 ordered.  Follow up in dec 2018/labs.

## 2016-09-01 NOTE — Progress Notes (Signed)
[place] OFFICE PROGRESS NOTE  Patient Care Team: Katheren Shams as PCP - General  No matching staging information was found for the patient.   Oncology History   # 2011- stage pII pT2 pN0 ( SN) M0  invasive ductal carcinoma of left breast , ER 0%, PR  1%, her2/neu not overexpressed who is s/p chemotherapy per protocol NSABP 47 Arm 1-A TC X 6 which she was randomized 08/27/10.  She has completed radiation to left breast per Dr. Baruch Gouty in June 2012  2.BRCA1 and BRCA2 mutations are not identified (December, 2014)      Carcinoma of overlapping sites of left breast in female, estrogen receptor negative ([place])     This is my first interaction with the patient as patient's primary oncologist has been Dr.Choksi. I reviewed the patient's prior charts/pertinent labs/imaging in detail; findings are summarized above.     INTERVAL HISTORY:  Kelly Green 60 y.o.  female pleasant patient above history of Left-sided stage II Breast cancer ER negative HER-2/neu negative status post chemotherapy in 2011 followed by radiation is here for follow-up.   Patient currently is doing well. Denies any unusual nausea vomiting. Denies any headaches. Denies any chills. Denies any tingling or numbness.  Patient has a chronic pain [more discomfort] in the right inframammary area especially at night times; improves with the change of position. Not getting any worse.  REVIEW OF SYSTEMS:  A complete 10 point review of system is done which is negative except mentioned above/history of present illness.   PAST MEDICAL HISTORY :  Past Medical History:  Diagnosis Date  . Breast cancer ([place]) 2011   left breast with lumpectomy, chemo and rad tx  . Hypertension     PAST SURGICAL HISTORY :   Past Surgical History:  Procedure Laterality Date  . BREAST CYST ASPIRATION Bilateral    neg  . BREAST LUMPECTOMY Left 2011   with radiation    FAMILY HISTORY :   Family History  Problem  Relation Age of Onset  . Breast cancer Neg Hx     SOCIAL HISTORY:   Social History  Substance Use Topics  . Smoking status: Never Smoker  . Smokeless tobacco: Not on file  . Alcohol use No    ALLERGIES:  is allergic to septra [sulfamethoxazole-trimethoprim].  MEDICATIONS:  Current Outpatient Prescriptions  Medication Sig Dispense Refill  . amLODipine (NORVASC) 5 MG tablet Take by mouth.    . Cholecalciferol (VITAMIN D-1000 MAX ST) 1000 UNITS tablet Take by mouth.    . ferrous gluconate (FERGON) 240 (27 FE) MG tablet Take by mouth.    . losartan-hydrochlorothiazide (HYZAAR) 100-25 MG tablet Take by mouth.    . Multiple Minerals-Vitamins (CALCIUM & VIT D3 BONE HEALTH PO) Take by mouth.    . Multiple Vitamin (MULTI-VITAMINS) TABS Take by mouth.    . Omega-3 Fatty Acids (FISH OIL) 1000 MG CAPS Take by mouth.    Marland Kitchen amLODipine (NORVASC) 5 MG tablet Take by mouth.    . fluticasone (FLONASE) 50 MCG/ACT nasal spray as needed.     Marland Kitchen losartan-hydrochlorothiazide (HYZAAR) 100-25 MG tablet Take by mouth.    . pantoprazole (PROTONIX) 40 MG tablet Take by mouth.    . zolpidem (AMBIEN) 5 MG tablet Take by mouth.     No current facility-administered medications for this visit.     PHYSICAL EXAMINATION: ECOG PERFORMANCE STATUS: 0 - Asymptomatic  BP 120/74 (BP Location: Left Arm, Patient Position: Sitting)   Pulse 96  Temp (!) 96.3 F (35.7 C) (Tympanic)   Resp 18   Wt 158 lb (71.7 kg)   BMI 29.85 kg/m   Filed Weights   09/01/16 1534  Weight: 158 lb (71.7 kg)    GENERAL: Well-nourished well-developed; Alert, no distress and comfortable.   Alone.  EYES: no pallor or icterus OROPHARYNX: no thrush or ulceration; good dentition  NECK: supple, no masses felt LYMPH:  no palpable lymphadenopathy in the cervical, axillary or inguinal regions LUNGS: clear to auscultation and  No wheeze or crackles HEART/CVS: regular rate & rhythm and no murmurs; No lower extremity edema ABDOMEN:abdomen  soft, non-tender and normal bowel sounds Musculoskeletal:no cyanosis of digits and no clubbing  PSYCH: alert & oriented x 3 with fluent speech NEURO: no focal motor/sensory deficits SKIN:  no rashes or significant lesions Right and left BREAST exam [in the presence of nurse]- no unusual skin changes or dominant masses felt. Surgical scars noted.   LABORATORY DATA:  I have reviewed the data as listed    Component Value Date/Time   NA 135 09/01/2016 1500   NA 140 08/28/2014 1511   K 3.6 09/01/2016 1500   K 3.6 08/28/2014 1511   CL 102 09/01/2016 1500   CL 104 08/28/2014 1511   CO2 27 09/01/2016 1500   CO2 32 08/28/2014 1511   GLUCOSE 100 (H) 09/01/2016 1500   GLUCOSE 99 08/28/2014 1511   BUN 13 09/01/2016 1500   BUN 16 08/28/2014 1511   CREATININE 1.05 (H) 09/01/2016 1500   CREATININE 0.66 08/28/2014 1511   CALCIUM 9.5 09/01/2016 1500   CALCIUM 9.3 08/28/2014 1511   PROT 7.6 09/01/2016 1500   PROT 7.2 08/28/2014 1511   ALBUMIN 4.8 09/01/2016 1500   ALBUMIN 4.0 08/28/2014 1511   AST 29 09/01/2016 1500   AST 20 08/28/2014 1511   ALT 20 09/01/2016 1500   ALT 29 08/28/2014 1511   ALKPHOS 122 09/01/2016 1500   ALKPHOS 116 08/28/2014 1511   BILITOT 0.6 09/01/2016 1500   BILITOT 0.4 08/28/2014 1511   GFRNONAA 56 (L) 09/01/2016 1500   GFRNONAA >60 08/28/2014 1511   GFRNONAA >60 02/20/2014 1518   GFRAA >60 09/01/2016 1500   GFRAA >60 08/28/2014 1511   GFRAA >60 02/20/2014 1518    No results found for: SPEP, UPEP  Lab Results  Component Value Date   WBC 7.6 09/01/2016   NEUTROABS 3.6 09/01/2016   HGB 13.4 09/01/2016   HCT 38.3 09/01/2016   MCV 79.9 (L) 09/01/2016   PLT 261 09/01/2016      Chemistry      Component Value Date/Time   NA 135 09/01/2016 1500   NA 140 08/28/2014 1511   K 3.6 09/01/2016 1500   K 3.6 08/28/2014 1511   CL 102 09/01/2016 1500   CL 104 08/28/2014 1511   CO2 27 09/01/2016 1500   CO2 32 08/28/2014 1511   BUN 13 09/01/2016 1500   BUN 16  08/28/2014 1511   CREATININE 1.05 (H) 09/01/2016 1500   CREATININE 0.66 08/28/2014 1511      Component Value Date/Time   CALCIUM 9.5 09/01/2016 1500   CALCIUM 9.3 08/28/2014 1511   ALKPHOS 122 09/01/2016 1500   ALKPHOS 116 08/28/2014 1511   AST 29 09/01/2016 1500   AST 20 08/28/2014 1511   ALT 20 09/01/2016 1500   ALT 29 08/28/2014 1511   BILITOT 0.6 09/01/2016 1500   BILITOT 0.4 08/28/2014 1511       RADIOGRAPHIC STUDIES: I have  personally reviewed the radiological images as listed and agreed with the findings in the report. No results found.   ASSESSMENT & PLAN:  Carcinoma of overlapping sites of left breast in female, estrogen receptor negative ([place]) # Stage II ER negative HER-2/neu negative left breast cancer status post chemotherapy [2012]; radiation. Recent mammogram August 2017 negative. Clinically no evidence of recurrence.  # right chest wall- clinically muscular pain.- If not improved recommend imaging/bone scan/CT scan  # mammo aug 2018 ordered.  Follow up in dec 2018/labs.   Orders Placed This Encounter  Procedures  . MM Digital Diagnostic Bilat    Standing Status:   Future    Standing Expiration Date:   09/01/2017    Order Specific Question:   Reason for Exam (SYMPTOM  OR DIAGNOSIS REQUIRED)    Answer:   annual exam    Order Specific Question:   Preferred imaging location?    Answer:   [facility]  . CBC with Differential/Platelet    Standing Status:   Future    Standing Expiration Date:   09/01/2017  . Comprehensive metabolic panel    Standing Status:   Future    Standing Expiration Date:   09/01/2017   All questions were answered. The patient knows to call the clinic with any problems, questions or concerns.      Cammie Sickle, MD 09/01/2016 4:50 PM

## 2016-09-01 NOTE — Progress Notes (Signed)
Patient states she has pain in her feet and hips.  Also states in the evening after eating a meal and sitting down she has an area under her right breast that hurts.  If she stretches or stands the pain subsides. Patient formerly saw Dr. Oliva Bustard.

## 2016-12-03 ENCOUNTER — Other Ambulatory Visit: Payer: Self-pay | Admitting: Physician Assistant

## 2016-12-03 DIAGNOSIS — M5416 Radiculopathy, lumbar region: Secondary | ICD-10-CM

## 2016-12-21 ENCOUNTER — Ambulatory Visit
Admission: RE | Admit: 2016-12-21 | Discharge: 2016-12-21 | Disposition: A | Discharge status: Home / Self Care | Payer: BLUE CROSS/BLUE SHIELD | Source: Ambulatory Visit | Attending: Physician Assistant | Admitting: Physician Assistant

## 2016-12-21 DIAGNOSIS — M5186 Other intervertebral disc disorders, lumbar region: Secondary | ICD-10-CM | POA: Diagnosis not present

## 2016-12-21 DIAGNOSIS — M5136 Other intervertebral disc degeneration, lumbar region: Secondary | ICD-10-CM | POA: Diagnosis not present

## 2016-12-21 DIAGNOSIS — M5416 Radiculopathy, lumbar region: Secondary | ICD-10-CM | POA: Diagnosis present

## 2016-12-21 DIAGNOSIS — M5126 Other intervertebral disc displacement, lumbar region: Secondary | ICD-10-CM | POA: Insufficient documentation

## 2016-12-21 DIAGNOSIS — M4186 Other forms of scoliosis, lumbar region: Secondary | ICD-10-CM | POA: Insufficient documentation

## 2017-01-13 ENCOUNTER — Encounter: Payer: Self-pay | Admitting: *Deleted

## 2017-01-14 ENCOUNTER — Encounter
Admission: RE | Disposition: A | Discharge status: Home / Self Care | Payer: Self-pay | Source: Ambulatory Visit | Attending: Gastroenterology

## 2017-01-14 ENCOUNTER — Ambulatory Visit
Admission: RE | Admit: 2017-01-14 | Discharge: 2017-01-14 | Disposition: A | Discharge status: Home / Self Care | Payer: BLUE CROSS/BLUE SHIELD | Source: Ambulatory Visit | Attending: Gastroenterology | Admitting: Gastroenterology

## 2017-01-14 ENCOUNTER — Ambulatory Visit: Payer: BLUE CROSS/BLUE SHIELD | Admitting: Certified Registered Nurse Anesthetist

## 2017-01-14 ENCOUNTER — Encounter: Payer: Self-pay | Admitting: Certified Registered Nurse Anesthetist

## 2017-01-14 DIAGNOSIS — Z86010 Personal history of colonic polyps: Secondary | ICD-10-CM | POA: Insufficient documentation

## 2017-01-14 DIAGNOSIS — E538 Deficiency of other specified B group vitamins: Secondary | ICD-10-CM | POA: Diagnosis not present

## 2017-01-14 DIAGNOSIS — Z79899 Other long term (current) drug therapy: Secondary | ICD-10-CM | POA: Diagnosis not present

## 2017-01-14 DIAGNOSIS — I10 Essential (primary) hypertension: Secondary | ICD-10-CM | POA: Diagnosis not present

## 2017-01-14 DIAGNOSIS — Z923 Personal history of irradiation: Secondary | ICD-10-CM | POA: Insufficient documentation

## 2017-01-14 DIAGNOSIS — Z853 Personal history of malignant neoplasm of breast: Secondary | ICD-10-CM | POA: Insufficient documentation

## 2017-01-14 DIAGNOSIS — Z1211 Encounter for screening for malignant neoplasm of colon: Secondary | ICD-10-CM | POA: Diagnosis not present

## 2017-01-14 DIAGNOSIS — D509 Iron deficiency anemia, unspecified: Secondary | ICD-10-CM | POA: Insufficient documentation

## 2017-01-14 DIAGNOSIS — E785 Hyperlipidemia, unspecified: Secondary | ICD-10-CM | POA: Insufficient documentation

## 2017-01-14 DIAGNOSIS — K5730 Diverticulosis of large intestine without perforation or abscess without bleeding: Secondary | ICD-10-CM | POA: Diagnosis not present

## 2017-01-14 DIAGNOSIS — Z9221 Personal history of antineoplastic chemotherapy: Secondary | ICD-10-CM | POA: Diagnosis not present

## 2017-01-14 HISTORY — DX: Thyrotoxicosis, unspecified without thyrotoxic crisis or storm: E05.90

## 2017-01-14 HISTORY — DX: Adolescent idiopathic scoliosis, site unspecified: M41.129

## 2017-01-14 HISTORY — PX: COLONOSCOPY WITH PROPOFOL: SHX5780

## 2017-01-14 HISTORY — DX: Deficiency of other specified B group vitamins: E53.8

## 2017-01-14 HISTORY — DX: Anemia, unspecified: D64.9

## 2017-01-14 HISTORY — DX: Hyperlipidemia, unspecified: E78.5

## 2017-01-14 SURGERY — COLONOSCOPY WITH PROPOFOL
Anesthesia: General

## 2017-01-14 MED ORDER — MIDAZOLAM HCL 2 MG/2ML IJ SOLN
INTRAMUSCULAR | Status: AC
  Filled 2017-01-14: qty 2

## 2017-01-14 MED ORDER — PROPOFOL 500 MG/50ML IV EMUL
INTRAVENOUS | Status: DC | PRN
  Administered 2017-01-14: 140 ug/kg/min via INTRAVENOUS

## 2017-01-14 MED ORDER — MIDAZOLAM HCL 2 MG/2ML IJ SOLN
INTRAMUSCULAR | Status: DC | PRN
  Administered 2017-01-14: 2 mg via INTRAVENOUS

## 2017-01-14 MED ORDER — SODIUM CHLORIDE 0.9 % IV SOLN
INTRAVENOUS | Status: DC
Start: 2017-01-14 — End: 2017-01-14
  Administered 2017-01-14: 09:00:00 via INTRAVENOUS

## 2017-01-14 MED ORDER — LIDOCAINE HCL (CARDIAC) 20 MG/ML IV SOLN
INTRAVENOUS | Status: DC | PRN
  Administered 2017-01-14: 60 mg via INTRAVENOUS

## 2017-01-14 MED ORDER — PROPOFOL 10 MG/ML IV BOLUS
INTRAVENOUS | Status: DC | PRN
  Administered 2017-01-14: 20 mg via INTRAVENOUS
  Administered 2017-01-14: 30 mg via INTRAVENOUS

## 2017-01-14 MED ORDER — PROPOFOL 500 MG/50ML IV EMUL
INTRAVENOUS | Status: AC
  Filled 2017-01-14: qty 50

## 2017-01-14 MED ORDER — SODIUM CHLORIDE 0.9 % IV SOLN: INTRAVENOUS | Status: DC

## 2017-01-14 MED ORDER — LIDOCAINE HCL 2 % IJ SOLN
INTRAMUSCULAR | Status: AC
  Filled 2017-01-14: qty 10

## 2017-01-14 MED ORDER — PHENYLEPHRINE HCL 10 MG/ML IJ SOLN
INTRAMUSCULAR | Status: DC | PRN
  Administered 2017-01-14 (×2): 100 ug via INTRAVENOUS

## 2017-01-14 NOTE — Anesthesia Postprocedure Evaluation (Signed)
Anesthesia Post Note  Patient: Kelly Green  Procedure(s) Performed: Procedure(s) (LRB): COLONOSCOPY WITH PROPOFOL (N/A)  Patient location during evaluation: PACU Anesthesia Type: General Level of consciousness: awake and alert and oriented Pain management: pain level controlled Vital Signs Assessment: post-procedure vital signs reviewed and stable Respiratory status: spontaneous breathing Cardiovascular status: blood pressure returned to baseline Anesthetic complications: no     Last Vitals:  Vitals:   01/14/17 1030 01/14/17 1100  BP: 117/83 113/69  Pulse: 61 72  Resp: 12 (!) 21  Temp:      Last Pain:  Vitals:   01/14/17 1019  TempSrc: Tympanic                 Khristina Janota

## 2017-01-14 NOTE — H&P (Signed)
Outpatient short stay form Pre-procedure 01/14/2017 9:32 AM Lollie Sails MD  Primary Physician: Jackelyn Poling NP  Reason for visit:  Colonoscopy  History of present illness:  Patient is a 61 year old female presenting today as above. She has a personal history of adenomatous colon polyps. Her last colonoscopy was in 2012. She tolerated her prep well. She takes no aspirin or blood thinning agents.    Current Facility-Administered Medications:  .  0.9 %  sodium chloride infusion, , Intravenous, Continuous, Lollie Sails, MD, Last Rate: 20 mL/hr at 01/14/17 0917 .  0.9 %  sodium chloride infusion, , Intravenous, Continuous, Lollie Sails, MD  Prescriptions Prior to Admission  Medication Sig Dispense Refill Last Dose  . amLODipine (NORVASC) 5 MG tablet Take by mouth.   01/14/2017 at 0610  . Cholecalciferol (VITAMIN D-1000 MAX ST) 1000 UNITS tablet Take by mouth.   01/13/2017 at Unknown time  . cyanocobalamin 1000 MCG tablet Take 1,000 mcg by mouth daily.   01/13/2017 at Unknown time  . ferrous gluconate (FERGON) 240 (27 FE) MG tablet Take by mouth.   Past Week at Unknown time  . fluticasone (FLONASE) 50 MCG/ACT nasal spray as needed.    Past Month at Unknown time  . losartan-hydrochlorothiazide (HYZAAR) 100-25 MG tablet Take by mouth.   01/14/2017 at 0610  . Multiple Minerals-Vitamins (CALCIUM & VIT D3 BONE HEALTH PO) Take by mouth.   Past Week at Unknown time  . Omega-3 Fatty Acids (FISH OIL) 1000 MG CAPS Take by mouth.   Past Week at Unknown time  . amLODipine (NORVASC) 5 MG tablet Take by mouth.     . losartan-hydrochlorothiazide (HYZAAR) 100-25 MG tablet Take by mouth.     . Multiple Vitamin (MULTI-VITAMINS) TABS Take by mouth.   Taking  . pantoprazole (PROTONIX) 40 MG tablet Take by mouth.        Allergies  Allergen Reactions  . Septra [Sulfamethoxazole-Trimethoprim] Rash     Past Medical History:  Diagnosis Date  . Anemia    iron def.anemia  . Breast cancer ([place])  2011   left breast with lumpectomy, chemo and rad tx  . Hyperlipemia   . Hypertension   . Hyperthyroidism   . Scoliosis, adolescent acquired   . Vitamin B 12 deficiency     Review of systems:      Physical Exam    Heart and lungs: Regular rate and rhythm without rub or gallop, lungs are bilaterally clear.    HEENT: Normocephalic atraumatic eyes are anicteric    Other:     Pertinant exam for procedure: Soft nontender nondistended bowel sounds positive normoactive.    Planned proceedures: Colonoscopy and indicated procedures. I have discussed the risks benefits and complications of procedures to include not limited to bleeding, infection, perforation and the risk of sedation and the patient wishes to proceed.    Lollie Sails, MD Gastroenterology 01/14/2017  9:32 AM

## 2017-01-14 NOTE — Transfer of Care (Signed)
Immediate Anesthesia Transfer of Care Note  Patient: Kelly Green  Procedure(s) Performed: Procedure(s): COLONOSCOPY WITH PROPOFOL (N/A)  Patient Location: PACU  Anesthesia Type:General  Level of Consciousness: sedated  Airway & Oxygen Therapy: Patient Spontanous Breathing and Patient connected to nasal cannula oxygen  Post-op Assessment: Report given to RN and Post -op Vital signs reviewed and stable  Post vital signs: Reviewed and stable  Last Vitals:  Vitals:   01/14/17 1017 01/14/17 1019  BP: (!) 92/51 (!) 92/51  Pulse: 67 70  Resp: 13 14  Temp: 36.1 C (!) 35.6 C    Last Pain:  Vitals:   01/14/17 1019  TempSrc: Tympanic         Complications: No apparent anesthesia complications

## 2017-01-14 NOTE — Anesthesia Procedure Notes (Signed)
Date/Time: 01/14/2017 9:40 AM Performed by: Johnna Acosta Pre-anesthesia Checklist: Patient identified, Emergency Drugs available, Suction available, Patient being monitored and Timeout performed Patient Re-evaluated:Patient Re-evaluated prior to inductionOxygen Delivery Method: Nasal cannula

## 2017-01-14 NOTE — Op Note (Signed)
[facility] Gastroenterology Patient Name: Kelly Green Procedure Date: 01/14/2017 9:38 AM MRN: [id number] Account #: [id number] Date of Birth: May 24, 1956 Admit Type: Outpatient Age: 61 Room: [facility] ENDO ROOM 3 Gender: Female Note Status: Finalized Procedure:            Colonoscopy Indications:          Personal history of colonic polyps Providers:            Lollie Sails, MD Referring MD:         Precious Bard, MD (Referring MD) Medicines:            Monitored Anesthesia Care Complications:        No immediate complications. Procedure:            Pre-Anesthesia Assessment:                       - ASA Grade Assessment: II - A patient with mild                        systemic disease.                       After obtaining informed consent, the colonoscope was                        passed under direct vision. Throughout the procedure,                        the patient's blood pressure, pulse, and oxygen                        saturations were monitored continuously. The                        Colonoscope was introduced through the anus and                        advanced to the the cecum, identified by appendiceal                        orifice and ileocecal valve. The colonoscopy was                        performed with moderate difficulty due to a tortuous                        colon. The patient tolerated the procedure well. The                        quality of the bowel preparation was good. Findings:      A few small-mouthed diverticula were found in the sigmoid colon and       descending colon.      The digital rectal exam was normal, note anal pillars.      The perianal exam findings include skin tags. Impression:           - Diverticulosis in the sigmoid colon and in the                        descending colon.                       -  No specimens collected. Recommendation:       - Repeat colonoscopy in 5 years for surveillance. Procedure  Code(s):    --- Professional ---                       [phone], Colonoscopy, flexible; diagnostic, including                        collection of specimen(s) by brushing or washing, when                        performed (separate procedure) Diagnosis Code(s):    --- Professional ---                       Z86.010, Personal history of colonic polyps                       K57.30, Diverticulosis of large intestine without                        perforation or abscess without bleeding CPT copyright 2016 American Medical Association. All rights reserved. The codes documented in this report are preliminary and upon coder review may  be revised to meet current compliance requirements. Lollie Sails, MD 01/14/2017 10:18:30 AM This report has been signed electronically. Number of Addenda: 0 Note Initiated On: 01/14/2017 9:38 AM Scope Withdrawal Time: 0 hours 10 minutes 49 seconds  Total Procedure Duration: 0 hours 24 minutes 41 seconds       [facility]

## 2017-01-14 NOTE — Anesthesia Post-op Follow-up Note (Cosign Needed)
Anesthesia QCDR form completed.        

## 2017-01-14 NOTE — Anesthesia Preprocedure Evaluation (Signed)
Anesthesia Evaluation  Patient identified by MRN, date of birth, ID band Patient awake    Reviewed: Allergy & Precautions, NPO status , Patient's Chart, lab work & pertinent test results  Airway Mallampati: II  TM Distance: >3 FB     Dental no notable dental hx.    Pulmonary neg pulmonary ROS,    Pulmonary exam normal        Cardiovascular hypertension, Pt. on medications Normal cardiovascular exam     Neuro/Psych negative neurological ROS  negative psych ROS   GI/Hepatic negative GI ROS, Neg liver ROS,   Endo/Other  Hyperthyroidism   Renal/GU negative Renal ROS     Musculoskeletal negative musculoskeletal ROS (+)   Abdominal Normal abdominal exam  (+)   Peds negative pediatric ROS (+)  Hematology  (+) anemia ,   Anesthesia Other Findings Past Medical History: No date: Anemia     Comment: iron def.anemia 2011: Breast cancer ([place])     Comment: left breast with lumpectomy, chemo and rad tx No date: Hyperlipemia No date: Hypertension No date: Hyperthyroidism No date: Scoliosis, adolescent acquired No date: Vitamin B 12 deficiency  Reproductive/Obstetrics                             Anesthesia Physical Anesthesia Plan  ASA: II  Anesthesia Plan: General   Post-op Pain Management:    Induction: Intravenous  Airway Management Planned: Nasal Cannula  Additional Equipment:   Intra-op Plan:   Post-operative Plan:   Informed Consent: I have reviewed the patients History and Physical, chart, labs and discussed the procedure including the risks, benefits and alternatives for the proposed anesthesia with the patient or authorized representative who has indicated his/her understanding and acceptance.   Dental advisory given  Plan Discussed with: CRNA and Surgeon  Anesthesia Plan Comments:         Anesthesia Quick Evaluation

## 2017-01-17 ENCOUNTER — Encounter: Payer: Self-pay | Admitting: Gastroenterology

## 2017-05-17 ENCOUNTER — Ambulatory Visit: Payer: BLUE CROSS/BLUE SHIELD

## 2017-06-03 ENCOUNTER — Ambulatory Visit
Admission: RE | Admit: 2017-06-03 | Discharge: 2017-06-03 | Disposition: A | Discharge status: Home / Self Care | Payer: BLUE CROSS/BLUE SHIELD | Source: Ambulatory Visit | Attending: Internal Medicine | Admitting: Internal Medicine

## 2017-06-03 DIAGNOSIS — C50812 Malignant neoplasm of overlapping sites of left female breast: Secondary | ICD-10-CM | POA: Diagnosis present

## 2017-06-03 DIAGNOSIS — Z171 Estrogen receptor negative status [ER-]: Secondary | ICD-10-CM | POA: Diagnosis present

## 2017-09-02 ENCOUNTER — Other Ambulatory Visit: Payer: Self-pay

## 2017-09-02 ENCOUNTER — Encounter: Payer: Self-pay | Admitting: Internal Medicine

## 2017-09-02 ENCOUNTER — Inpatient Hospital Stay (HOSPITAL_BASED_OUTPATIENT_CLINIC_OR_DEPARTMENT_OTHER): Payer: BLUE CROSS/BLUE SHIELD | Admitting: Internal Medicine

## 2017-09-02 ENCOUNTER — Inpatient Hospital Stay: Payer: BLUE CROSS/BLUE SHIELD | Attending: Internal Medicine | Admitting: *Deleted

## 2017-09-02 VITALS — BP 161/97 | HR 98 | Temp 97.8°F | Resp 20

## 2017-09-02 DIAGNOSIS — Z79899 Other long term (current) drug therapy: Secondary | ICD-10-CM | POA: Diagnosis not present

## 2017-09-02 DIAGNOSIS — Z171 Estrogen receptor negative status [ER-]: Secondary | ICD-10-CM

## 2017-09-02 DIAGNOSIS — Z923 Personal history of irradiation: Secondary | ICD-10-CM | POA: Insufficient documentation

## 2017-09-02 DIAGNOSIS — Z853 Personal history of malignant neoplasm of breast: Secondary | ICD-10-CM | POA: Insufficient documentation

## 2017-09-02 DIAGNOSIS — Z9221 Personal history of antineoplastic chemotherapy: Secondary | ICD-10-CM | POA: Diagnosis not present

## 2017-09-02 DIAGNOSIS — E0590 Thyrotoxicosis, unspecified without thyrotoxic crisis or storm: Secondary | ICD-10-CM | POA: Insufficient documentation

## 2017-09-02 DIAGNOSIS — I10 Essential (primary) hypertension: Secondary | ICD-10-CM | POA: Insufficient documentation

## 2017-09-02 DIAGNOSIS — C50912 Malignant neoplasm of unspecified site of left female breast: Secondary | ICD-10-CM

## 2017-09-02 DIAGNOSIS — E785 Hyperlipidemia, unspecified: Secondary | ICD-10-CM | POA: Diagnosis not present

## 2017-09-02 DIAGNOSIS — E538 Deficiency of other specified B group vitamins: Secondary | ICD-10-CM | POA: Insufficient documentation

## 2017-09-02 DIAGNOSIS — C50812 Malignant neoplasm of overlapping sites of left female breast: Secondary | ICD-10-CM

## 2017-09-02 LAB — CBC WITH DIFFERENTIAL/PLATELET
BASOS ABS: 0 10*3/uL (ref 0–0.1)
Basophils Relative: 0 %
EOS PCT: 2 %
Eosinophils Absolute: 0.1 10*3/uL (ref 0–0.7)
HEMATOCRIT: 39.3 % (ref 35.0–47.0)
Hemoglobin: 13.4 g/dL (ref 12.0–16.0)
LYMPHS PCT: 40 %
Lymphs Abs: 2.8 10*3/uL (ref 1.0–3.6)
MCH: 27.6 pg (ref 26.0–34.0)
MCHC: 34 g/dL (ref 32.0–36.0)
MCV: 81.3 fL (ref 80.0–100.0)
MONO ABS: 0.4 10*3/uL (ref 0.2–0.9)
MONOS PCT: 6 %
Neutro Abs: 3.6 10*3/uL (ref 1.4–6.5)
Neutrophils Relative %: 52 %
PLATELETS: 256 10*3/uL (ref 150–440)
RBC: 4.84 MIL/uL (ref 3.80–5.20)
RDW: 13.2 % (ref 11.5–14.5)
WBC: 7 10*3/uL (ref 3.6–11.0)

## 2017-09-02 LAB — COMPREHENSIVE METABOLIC PANEL
ALBUMIN: 4.5 g/dL (ref 3.5–5.0)
ALK PHOS: 109 U/L (ref 38–126)
ALT: 17 U/L (ref 14–54)
AST: 28 U/L (ref 15–41)
Anion gap: 13 (ref 5–15)
BILIRUBIN TOTAL: 0.7 mg/dL (ref 0.3–1.2)
BUN: 11 mg/dL (ref 6–20)
CO2: 23 mmol/L (ref 22–32)
Calcium: 9.5 mg/dL (ref 8.9–10.3)
Chloride: 102 mmol/L (ref 101–111)
Creatinine, Ser: 0.58 mg/dL (ref 0.44–1.00)
GFR calc Af Amer: >60 mL/min (ref >60)
GFR calc non Af Amer: >60 mL/min (ref >60)
GLUCOSE: 103 mg/dL — AB (ref 65–99)
POTASSIUM: 3.3 mmol/L — AB (ref 3.5–5.1)
SODIUM: 138 mmol/L (ref 135–145)
TOTAL PROTEIN: 7.5 g/dL (ref 6.5–8.1)

## 2017-09-02 NOTE — Assessment & Plan Note (Addendum)
#  Stage II ER negative HER-2/neu negative left breast cancer status post chemotherapy [2012]; radiation. Recent mammogram August 2018 negative. Clinically no evidence of recurrence.  # right chest wall- clinically muscular pain.- If not improved recommend imaging/bone scan/CT scan  # mammo aug 2019 ordered; Follow up in dec 2019/labs.

## 2017-09-02 NOTE — Progress Notes (Signed)
[facility] OFFICE PROGRESS NOTE  Patient Care Team: Katheren Shams as PCP - General  Cancer Staging No matching staging information was found for the patient.   Oncology History   # 2011- stage pII pT2 pN0 ( SN) M0  invasive ductal carcinoma of left breast , ER 0%, PR  1%, her2/neu not overexpressed who is s/p chemotherapy per protocol NSABP 47 Arm 1-A TC X 6 which she was randomized 08/27/10.  She has completed radiation to left breast per Dr. Baruch Gouty in June 2012  2.BRCA1 and BRCA2 mutations are not identified (December, 2014)      Carcinoma of overlapping sites of left breast in female, estrogen receptor negative ([place])      INTERVAL HISTORY:  Kelly Green 61 y.o.  female pleasant patient above history of Left-sided stage II Breast cancer ER negative HER-2/neu negative status post chemotherapy in 2011 followed by radiation is here for follow-up.   Patient currently is doing well. Denies any unusual nausea vomiting. Denies any headaches. Denies any chills. Denies any tingling or numbness.  Chronic left-sided chest pain/post lumpectomy-not any worse.  REVIEW OF SYSTEMS:  A complete 10 point review of system is done which is negative except mentioned above/history of present illness.   PAST MEDICAL HISTORY :  Past Medical History:  Diagnosis Date  . Anemia    iron def.anemia  . Breast cancer ([place]) 2011   left breast with lumpectomy, chemo and rad tx  . Hyperlipemia   . Hypertension   . Hyperthyroidism   . Scoliosis, adolescent acquired   . Vitamin B 12 deficiency     PAST SURGICAL HISTORY :   Past Surgical History:  Procedure Laterality Date  . APPENDECTOMY    . BREAST CYST ASPIRATION Bilateral    neg  . BREAST LUMPECTOMY Left 2011   with radiation  . COLONOSCOPY WITH PROPOFOL N/A 01/14/2017   Procedure: COLONOSCOPY WITH PROPOFOL;  Surgeon: Lollie Sails, MD;  Location: [facility] ENDOSCOPY;  Service: Endoscopy;  Laterality: N/A;  . Thyroid  Goiter Removal  35 years agi    FAMILY HISTORY :   Family History  Problem Relation Age of Onset  . Breast cancer Neg Hx     SOCIAL HISTORY:   Social History   Tobacco Use  . Smoking status: Never Smoker  . Smokeless tobacco: Never Used  Substance Use Topics  . Alcohol use: No  . Drug use: No    ALLERGIES:  is allergic to septra [sulfamethoxazole-trimethoprim].  MEDICATIONS:  Current Outpatient Medications  Medication Sig Dispense Refill  . amLODipine (NORVASC) 5 MG tablet Take 5 mg by mouth daily.     . Cholecalciferol (VITAMIN D-1000 MAX ST) 1000 UNITS tablet Take by mouth.    . ferrous gluconate (FERGON) 240 (27 FE) MG tablet Take 240 mg by mouth daily.     . fluticasone (FLONASE) 50 MCG/ACT nasal spray as needed.     Marland Kitchen losartan-hydrochlorothiazide (HYZAAR) 100-25 MG tablet Take by mouth.    . Multiple Minerals-Vitamins (CALCIUM & VIT D3 BONE HEALTH PO) Take by mouth.    . Multiple Vitamin (MULTI-VITAMINS) TABS Take by mouth.    . Omega-3 Fatty Acids (FISH OIL) 1000 MG CAPS Take by mouth.    . losartan-hydrochlorothiazide (HYZAAR) 100-25 MG tablet Take by mouth.     No current facility-administered medications for this visit.     PHYSICAL EXAMINATION: ECOG PERFORMANCE STATUS: 0 - Asymptomatic  BP (!) 161/97   Pulse 98  Temp 97.8 F (36.6 C) (Tympanic)   Resp 20   There were no vitals filed for this visit.  GENERAL: Well-nourished well-developed; Alert, no distress and comfortable.   Alone.  EYES: no pallor or icterus OROPHARYNX: no thrush or ulceration; good dentition  NECK: supple, no masses felt LYMPH:  no palpable lymphadenopathy in the cervical, axillary or inguinal regions LUNGS: clear to auscultation and  No wheeze or crackles HEART/CVS: regular rate & rhythm and no murmurs; No lower extremity edema ABDOMEN:abdomen soft, non-tender and normal bowel sounds Musculoskeletal:no cyanosis of digits and no clubbing  PSYCH: alert & oriented x 3 with fluent  speech NEURO: no focal motor/sensory deficits SKIN:  no rashes or significant lesions Right and left BREAST exam [in the presence of nurse]- no unusual skin changes or dominant masses felt. Surgical scars noted.   LABORATORY DATA:  I have reviewed the data as listed    Component Value Date/Time   NA 138 09/02/2017 1456   NA 140 08/28/2014 1511   K 3.3 (L) 09/02/2017 1456   K 3.6 08/28/2014 1511   CL 102 09/02/2017 1456   CL 104 08/28/2014 1511   CO2 23 09/02/2017 1456   CO2 32 08/28/2014 1511   GLUCOSE 103 (H) 09/02/2017 1456   GLUCOSE 99 08/28/2014 1511   BUN 11 09/02/2017 1456   BUN 16 08/28/2014 1511   CREATININE 0.58 09/02/2017 1456   CREATININE 0.66 08/28/2014 1511   CALCIUM 9.5 09/02/2017 1456   CALCIUM 9.3 08/28/2014 1511   PROT 7.5 09/02/2017 1456   PROT 7.2 08/28/2014 1511   ALBUMIN 4.5 09/02/2017 1456   ALBUMIN 4.0 08/28/2014 1511   AST 28 09/02/2017 1456   AST 20 08/28/2014 1511   ALT 17 09/02/2017 1456   ALT 29 08/28/2014 1511   ALKPHOS 109 09/02/2017 1456   ALKPHOS 116 08/28/2014 1511   BILITOT 0.7 09/02/2017 1456   BILITOT 0.4 08/28/2014 1511   GFRNONAA >60 09/02/2017 1456   GFRNONAA >60 08/28/2014 1511   GFRNONAA >60 02/20/2014 1518   GFRAA >60 09/02/2017 1456   GFRAA >60 08/28/2014 1511   GFRAA >60 02/20/2014 1518    No results found for: SPEP, UPEP  Lab Results  Component Value Date   WBC 7.0 09/02/2017   NEUTROABS 3.6 09/02/2017   HGB 13.4 09/02/2017   HCT 39.3 09/02/2017   MCV 81.3 09/02/2017   PLT 256 09/02/2017      Chemistry      Component Value Date/Time   NA 138 09/02/2017 1456   NA 140 08/28/2014 1511   K 3.3 (L) 09/02/2017 1456   K 3.6 08/28/2014 1511   CL 102 09/02/2017 1456   CL 104 08/28/2014 1511   CO2 23 09/02/2017 1456   CO2 32 08/28/2014 1511   BUN 11 09/02/2017 1456   BUN 16 08/28/2014 1511   CREATININE 0.58 09/02/2017 1456   CREATININE 0.66 08/28/2014 1511      Component Value Date/Time   CALCIUM 9.5  09/02/2017 1456   CALCIUM 9.3 08/28/2014 1511   ALKPHOS 109 09/02/2017 1456   ALKPHOS 116 08/28/2014 1511   AST 28 09/02/2017 1456   AST 20 08/28/2014 1511   ALT 17 09/02/2017 1456   ALT 29 08/28/2014 1511   BILITOT 0.7 09/02/2017 1456   BILITOT 0.4 08/28/2014 1511       RADIOGRAPHIC STUDIES: I have personally reviewed the radiological images as listed and agreed with the findings in the report. No results found.   ASSESSMENT &  PLAN:  Carcinoma of overlapping sites of left breast in female, estrogen receptor negative ([place]) # Stage II ER negative HER-2/neu negative left breast cancer status post chemotherapy [2012]; radiation. Recent mammogram August 2018 negative. Clinically no evidence of recurrence.  # right chest wall- clinically muscular pain.- If not improved recommend imaging/bone scan/CT scan  # mammo aug 2019 ordered; Follow up in dec 2019/labs.   Orders Placed This Encounter  Procedures  . MM SCREENING BREAST TOMO BILATERAL    Standing Status:   Future    Standing Expiration Date:   11/03/2018    Order Specific Question:   Reason for Exam (SYMPTOM  OR DIAGNOSIS REQUIRED)    Answer:   history of breast cancer    Order Specific Question:   Preferred imaging location?    Answer:   [facility]  . CBC with Differential    Standing Status:   Future    Standing Expiration Date:   03/04/2019  . Comprehensive metabolic panel    Standing Status:   Future    Standing Expiration Date:   03/04/2019  . Cancer antigen 27.29    Standing Status:   Future    Standing Expiration Date:   03/04/2019   All questions were answered. The patient knows to call the clinic with any problems, questions or concerns.      Cammie Sickle, MD 09/09/2017 8:07 AM

## 2018-06-06 ENCOUNTER — Ambulatory Visit
Admission: RE | Admit: 2018-06-06 | Discharge: 2018-06-06 | Disposition: A | Discharge status: Home / Self Care | Payer: BLUE CROSS/BLUE SHIELD | Source: Ambulatory Visit | Attending: Internal Medicine | Admitting: Internal Medicine

## 2018-06-06 DIAGNOSIS — C50812 Malignant neoplasm of overlapping sites of left female breast: Secondary | ICD-10-CM | POA: Diagnosis present

## 2018-06-06 DIAGNOSIS — Z171 Estrogen receptor negative status [ER-]: Secondary | ICD-10-CM | POA: Insufficient documentation

## 2018-06-06 HISTORY — DX: Personal history of irradiation: Z92.3

## 2018-06-06 HISTORY — DX: Personal history of antineoplastic chemotherapy: Z92.21

## 2018-09-04 ENCOUNTER — Encounter: Payer: Self-pay | Admitting: Internal Medicine

## 2018-09-04 ENCOUNTER — Inpatient Hospital Stay (HOSPITAL_BASED_OUTPATIENT_CLINIC_OR_DEPARTMENT_OTHER): Payer: BLUE CROSS/BLUE SHIELD | Admitting: Internal Medicine

## 2018-09-04 ENCOUNTER — Inpatient Hospital Stay: Payer: BLUE CROSS/BLUE SHIELD | Attending: Internal Medicine

## 2018-09-04 DIAGNOSIS — Z79899 Other long term (current) drug therapy: Secondary | ICD-10-CM | POA: Insufficient documentation

## 2018-09-04 DIAGNOSIS — M25551 Pain in right hip: Secondary | ICD-10-CM | POA: Insufficient documentation

## 2018-09-04 DIAGNOSIS — G4733 Obstructive sleep apnea (adult) (pediatric): Secondary | ICD-10-CM

## 2018-09-04 DIAGNOSIS — G8929 Other chronic pain: Secondary | ICD-10-CM | POA: Diagnosis not present

## 2018-09-04 DIAGNOSIS — Z923 Personal history of irradiation: Secondary | ICD-10-CM | POA: Diagnosis not present

## 2018-09-04 DIAGNOSIS — Z853 Personal history of malignant neoplasm of breast: Secondary | ICD-10-CM

## 2018-09-04 DIAGNOSIS — Z171 Estrogen receptor negative status [ER-]: Secondary | ICD-10-CM | POA: Diagnosis not present

## 2018-09-04 DIAGNOSIS — I10 Essential (primary) hypertension: Secondary | ICD-10-CM | POA: Insufficient documentation

## 2018-09-04 DIAGNOSIS — Z9221 Personal history of antineoplastic chemotherapy: Secondary | ICD-10-CM

## 2018-09-04 DIAGNOSIS — C50812 Malignant neoplasm of overlapping sites of left female breast: Secondary | ICD-10-CM

## 2018-09-04 LAB — CBC WITH DIFFERENTIAL/PLATELET
ABS IMMATURE GRANULOCYTES: 0.03 10*3/uL (ref 0.00–0.07)
BASOS PCT: 0 %
Basophils Absolute: 0 10*3/uL (ref 0.0–0.1)
Eosinophils Absolute: 0.1 10*3/uL (ref 0.0–0.5)
Eosinophils Relative: 2 %
HCT: 38.4 % (ref 36.0–46.0)
Hemoglobin: 12.9 g/dL (ref 12.0–15.0)
IMMATURE GRANULOCYTES: 1 %
Lymphocytes Relative: 40 %
Lymphs Abs: 2.4 10*3/uL (ref 0.7–4.0)
MCH: 27.1 pg (ref 26.0–34.0)
MCHC: 33.6 g/dL (ref 30.0–36.0)
MCV: 80.7 fL (ref 80.0–100.0)
Monocytes Absolute: 0.4 10*3/uL (ref 0.1–1.0)
Monocytes Relative: 7 %
NEUTROS ABS: 3 10*3/uL (ref 1.7–7.7)
NEUTROS PCT: 50 %
NRBC: 0 % (ref 0.0–0.2)
PLATELETS: 210 10*3/uL (ref 150–400)
RBC: 4.76 MIL/uL (ref 3.87–5.11)
RDW: 12.3 % (ref 11.5–15.5)
WBC: 5.9 10*3/uL (ref 4.0–10.5)

## 2018-09-04 LAB — COMPREHENSIVE METABOLIC PANEL
ALBUMIN: 4.4 g/dL (ref 3.5–5.0)
ALT: 17 U/L (ref 0–44)
ANION GAP: 10 (ref 5–15)
AST: 22 U/L (ref 15–41)
Alkaline Phosphatase: 101 U/L (ref 38–126)
BILIRUBIN TOTAL: 0.8 mg/dL (ref 0.3–1.2)
BUN: 15 mg/dL (ref 8–23)
CO2: 27 mmol/L (ref 22–32)
Calcium: 9.7 mg/dL (ref 8.9–10.3)
Chloride: 101 mmol/L (ref 98–111)
Creatinine, Ser: 0.6 mg/dL (ref 0.44–1.00)
GFR calc Af Amer: >60 mL/min (ref >60)
GLUCOSE: 98 mg/dL (ref 70–99)
POTASSIUM: 4.1 mmol/L (ref 3.5–5.1)
Sodium: 138 mmol/L (ref 135–145)
TOTAL PROTEIN: 7.4 g/dL (ref 6.5–8.1)

## 2018-09-04 NOTE — Progress Notes (Signed)
[facility] OFFICE PROGRESS NOTE  Patient Care Team: Katheren Shams as PCP - General  Cancer Staging No matching staging information was found for the patient.   Oncology History   # 2011- stage pII pT2 pN0 ( SN) M0  invasive ductal carcinoma of left breast , ER 0%, PR  1%, her2/neu not overexpressed who is s/p chemotherapy per protocol NSABP 47 Arm 1-A TC X 6 which she was randomized 08/27/10.  She has completed radiation to left breast per Dr. Baruch Gouty in June 2012  2.BRCA1 and BRCA2 mutations are not identified (December, 2014)  #Obstructive sleep apnea/CPAP 2019.   DIAGNOSIS: breast cancer  STAGE:   II     ;GOALS: cure  CURRENT/MOST RECENT THERAPY: surveillaince        Carcinoma of overlapping sites of left breast in female, estrogen receptor negative ([place])      INTERVAL HISTORY:  Kelly Green 62 y.o.  female pleasant patient above history of triple negative breast cancer stage II is here for follow-up.  Patient continues to have chronic right-sided hip pain.  Not any worse.  Intermittent.  Denies any tingling or numbness.  Appetite is good.  No nausea no vomiting.  No headaches.  Review of Systems  Constitutional: Negative for chills, diaphoresis, fever, malaise/fatigue and weight loss.  HENT: Negative for nosebleeds and sore throat.   Eyes: Negative for double vision.  Respiratory: Negative for cough, hemoptysis, sputum production, shortness of breath and wheezing.   Cardiovascular: Negative for chest pain, palpitations, orthopnea and leg swelling.  Gastrointestinal: Negative for abdominal pain, blood in stool, constipation, diarrhea, heartburn, melena, nausea and vomiting.  Genitourinary: Negative for dysuria, frequency and urgency.  Musculoskeletal: Negative for back pain and joint pain.  Skin: Negative.  Negative for itching and rash.  Neurological: Negative for dizziness, tingling, focal weakness, weakness and headaches.   Endo/Heme/Allergies: Does not bruise/bleed easily.  Psychiatric/Behavioral: Negative for depression. The patient is not nervous/anxious and does not have insomnia.      PAST MEDICAL HISTORY :  Past Medical History:  Diagnosis Date  . Anemia    iron def.anemia  . Breast cancer ([place]) 2011   left breast with lumpectomy, chemo and rad tx  . Hyperlipemia   . Hypertension   . Hyperthyroidism   . Personal history of chemotherapy   . Personal history of radiation therapy   . Scoliosis, adolescent acquired   . Vitamin B 12 deficiency     PAST SURGICAL HISTORY :   Past Surgical History:  Procedure Laterality Date  . APPENDECTOMY    . BREAST BIOPSY Left 2011   neg with concordant  . BREAST CYST ASPIRATION Bilateral    neg  . BREAST LUMPECTOMY Left 2011   with radiation and chemo  . COLONOSCOPY WITH PROPOFOL N/A 01/14/2017   Procedure: COLONOSCOPY WITH PROPOFOL;  Surgeon: Lollie Sails, MD;  Location: [facility] ENDOSCOPY;  Service: Endoscopy;  Laterality: N/A;  . Thyroid Goiter Removal  35 years agi    FAMILY HISTORY :   Family History  Problem Relation Age of Onset  . Breast cancer Neg Hx     SOCIAL HISTORY:   Social History   Tobacco Use  . Smoking status: Never Smoker  . Smokeless tobacco: Never Used  Substance Use Topics  . Alcohol use: No  . Drug use: No    ALLERGIES:  is allergic to septra [sulfamethoxazole-trimethoprim].  MEDICATIONS:  Current Outpatient Medications  Medication Sig Dispense Refill  . amLODipine (NORVASC)  5 MG tablet Take 5 mg by mouth daily.     . Cholecalciferol (VITAMIN D-1000 MAX ST) 1000 UNITS tablet Take by mouth.    . ferrous gluconate (FERGON) 240 (27 FE) MG tablet Take 240 mg by mouth daily.     . fluticasone (FLONASE) 50 MCG/ACT nasal spray as needed.     Marland Kitchen losartan-hydrochlorothiazide (HYZAAR) 100-25 MG tablet Take by mouth.    . Multiple Minerals-Vitamins (CALCIUM & VIT D3 BONE HEALTH PO) Take by mouth.    . Multiple Vitamin  (MULTI-VITAMINS) TABS Take by mouth.    . Omega-3 Fatty Acids (FISH OIL) 1000 MG CAPS Take by mouth.    . losartan-hydrochlorothiazide (HYZAAR) 100-25 MG tablet Take by mouth.     No current facility-administered medications for this visit.     PHYSICAL EXAMINATION: ECOG PERFORMANCE STATUS: 0 - Asymptomatic  BP (!) 159/94 (BP Location: Left Arm, Patient Position: Sitting, Cuff Size: Normal)   Pulse 80   Temp (!) 97.3 F (36.3 C) (Tympanic)   Resp 16   Wt 157 lb (71.2 kg)   BMI 29.66 kg/m   Filed Weights   09/04/18 1037  Weight: 157 lb (71.2 kg)    Physical Exam  Constitutional: She is oriented to person, place, and time and well-developed, well-nourished, and in no distress.  HENT:  Head: Normocephalic and atraumatic.  Mouth/Throat: Oropharynx is clear and moist. No oropharyngeal exudate.  Eyes: Pupils are equal, round, and reactive to light.  Neck: Normal range of motion. Neck supple.  Cardiovascular: Normal rate and regular rhythm.  Pulmonary/Chest: No respiratory distress. She has no wheezes.  Abdominal: Soft. Bowel sounds are normal. She exhibits no distension and no mass. There is no abdominal tenderness. There is no rebound and no guarding.  Musculoskeletal: Normal range of motion.        General: No tenderness or edema.  Neurological: She is alert and oriented to person, place, and time.  Skin: Skin is warm.  Right and left BREAST exam (in the presence of nurse)- no unusual skin changes or dominant masses felt. Surgical scars noted.    Psychiatric: Affect normal.     LABORATORY DATA:  I have reviewed the data as listed    Component Value Date/Time   NA 138 09/04/2018 0954   NA 140 08/28/2014 1511   K 4.1 09/04/2018 0954   K 3.6 08/28/2014 1511   CL 101 09/04/2018 0954   CL 104 08/28/2014 1511   CO2 27 09/04/2018 0954   CO2 32 08/28/2014 1511   GLUCOSE 98 09/04/2018 0954   GLUCOSE 99 08/28/2014 1511   BUN 15 09/04/2018 0954   BUN 16 08/28/2014 1511    CREATININE 0.60 09/04/2018 0954   CREATININE 0.66 08/28/2014 1511   CALCIUM 9.7 09/04/2018 0954   CALCIUM 9.3 08/28/2014 1511   PROT 7.4 09/04/2018 0954   PROT 7.2 08/28/2014 1511   ALBUMIN 4.4 09/04/2018 0954   ALBUMIN 4.0 08/28/2014 1511   AST 22 09/04/2018 0954   AST 20 08/28/2014 1511   ALT 17 09/04/2018 0954   ALT 29 08/28/2014 1511   ALKPHOS 101 09/04/2018 0954   ALKPHOS 116 08/28/2014 1511   BILITOT 0.8 09/04/2018 0954   BILITOT 0.4 08/28/2014 1511   GFRNONAA >60 09/04/2018 0954   GFRNONAA >60 08/28/2014 1511   GFRNONAA >60 02/20/2014 1518   GFRAA >60 09/04/2018 0954   GFRAA >60 08/28/2014 1511   GFRAA >60 02/20/2014 1518    No results found for: SPEP, UPEP  Lab Results  Component Value Date   WBC 5.9 09/04/2018   NEUTROABS 3.0 09/04/2018   HGB 12.9 09/04/2018   HCT 38.4 09/04/2018   MCV 80.7 09/04/2018   PLT 210 09/04/2018      Chemistry      Component Value Date/Time   NA 138 09/04/2018 0954   NA 140 08/28/2014 1511   K 4.1 09/04/2018 0954   K 3.6 08/28/2014 1511   CL 101 09/04/2018 0954   CL 104 08/28/2014 1511   CO2 27 09/04/2018 0954   CO2 32 08/28/2014 1511   BUN 15 09/04/2018 0954   BUN 16 08/28/2014 1511   CREATININE 0.60 09/04/2018 0954   CREATININE 0.66 08/28/2014 1511      Component Value Date/Time   CALCIUM 9.7 09/04/2018 0954   CALCIUM 9.3 08/28/2014 1511   ALKPHOS 101 09/04/2018 0954   ALKPHOS 116 08/28/2014 1511   AST 22 09/04/2018 0954   AST 20 08/28/2014 1511   ALT 17 09/04/2018 0954   ALT 29 08/28/2014 1511   BILITOT 0.8 09/04/2018 0954   BILITOT 0.4 08/28/2014 1511       RADIOGRAPHIC STUDIES: I have personally reviewed the radiological images as listed and agreed with the findings in the report. No results found.   ASSESSMENT & PLAN:  Carcinoma of overlapping sites of left breast in female, estrogen receptor negative ([place]) # Stage II ER negative HER-2/neu negative left breast cancer status post chemotherapy [2012];  radiation. STABLE.  #Clinically stable.  No evidence of recurrence.  # Right hip pain chronic-no concerns for recurrence.  STABLE.  # HTN 154/94-under control as per patient at home.  Continue home medications.  # OSA/sleep study- stable.   #Triple negative breast cancer less than 18 years of age BRCA 76 and 2- otherwise no family history of no breast cancers/ ovarian cancers.  Discussed regarding expanded testing rather than testing her daughter at this time.  Discussed regarding genetic counseling.  Patient will let us know if she is interested.  # DISPOSITION: Will call if CA-27-29 is abnormal. # follow up in 12 months/ labs-cbc/cmp/ca-27-29/ Mammogram prior- Dr.B   No orders of the defined types were placed in this encounter.  All questions were answered. The patient knows to call the clinic with any problems, questions or concerns.      Cammie Sickle, MD 09/04/2018 11:14 AM

## 2018-09-04 NOTE — Assessment & Plan Note (Addendum)
#  Stage II ER negative HER-2/neu negative left breast cancer status post chemotherapy [2012]; radiation. STABLE.  #Clinically stable.  No evidence of recurrence.  # Right hip pain chronic-no concerns for recurrence.  STABLE.  # HTN 154/94-under control as per patient at home.  Continue home medications.  # OSA/sleep study- stable.   #Triple negative breast cancer less than 64 years of age BRCA 31 and 2- otherwise no family history of no breast cancers/ ovarian cancers.  Discussed regarding expanded testing rather than testing her daughter at this time.  Discussed regarding genetic counseling.  Patient will let us know if she is interested.  # DISPOSITION: Will call if CA-27-29 is abnormal. # follow up in 12 months/ labs-cbc/cmp/ca-27-29/ Mammogram prior- Dr.B

## 2018-09-05 LAB — CANCER ANTIGEN 27.29: CAN 27.29: 18.7 U/mL (ref 0.0–38.6)

## 2019-04-13 ENCOUNTER — Other Ambulatory Visit: Payer: Self-pay | Admitting: Physician Assistant

## 2019-04-13 DIAGNOSIS — Z1231 Encounter for screening mammogram for malignant neoplasm of breast: Secondary | ICD-10-CM

## 2019-07-31 ENCOUNTER — Ambulatory Visit
Admission: RE | Admit: 2019-07-31 | Discharge: 2019-07-31 | Disposition: A | Discharge status: Home / Self Care | Payer: BC Managed Care – PPO | Source: Ambulatory Visit | Attending: Physician Assistant | Admitting: Physician Assistant

## 2019-07-31 DIAGNOSIS — Z1231 Encounter for screening mammogram for malignant neoplasm of breast: Secondary | ICD-10-CM | POA: Diagnosis not present

## 2019-09-05 ENCOUNTER — Inpatient Hospital Stay: Payer: BC Managed Care – PPO | Attending: Internal Medicine

## 2019-09-05 ENCOUNTER — Inpatient Hospital Stay: Payer: BC Managed Care – PPO | Admitting: Internal Medicine

## 2019-09-05 ENCOUNTER — Other Ambulatory Visit: Payer: Self-pay

## 2019-09-05 ENCOUNTER — Encounter: Payer: Self-pay | Admitting: Internal Medicine

## 2019-09-05 ENCOUNTER — Other Ambulatory Visit: Payer: Self-pay | Admitting: *Deleted

## 2019-09-05 DIAGNOSIS — C50812 Malignant neoplasm of overlapping sites of left female breast: Secondary | ICD-10-CM

## 2019-09-05 DIAGNOSIS — Z853 Personal history of malignant neoplasm of breast: Secondary | ICD-10-CM | POA: Insufficient documentation

## 2019-09-05 DIAGNOSIS — M25551 Pain in right hip: Secondary | ICD-10-CM | POA: Insufficient documentation

## 2019-09-05 DIAGNOSIS — G8929 Other chronic pain: Secondary | ICD-10-CM | POA: Insufficient documentation

## 2019-09-05 DIAGNOSIS — Z171 Estrogen receptor negative status [ER-]: Secondary | ICD-10-CM

## 2019-09-05 DIAGNOSIS — M65311 Trigger thumb, right thumb: Secondary | ICD-10-CM | POA: Insufficient documentation

## 2019-09-05 DIAGNOSIS — I10 Essential (primary) hypertension: Secondary | ICD-10-CM | POA: Insufficient documentation

## 2019-09-05 LAB — COMPREHENSIVE METABOLIC PANEL
ALT: 16 U/L (ref 0–44)
AST: 22 U/L (ref 15–41)
Albumin: 4.4 g/dL (ref 3.5–5.0)
Alkaline Phosphatase: 115 U/L (ref 38–126)
Anion gap: 8 (ref 5–15)
BUN: 16 mg/dL (ref 8–23)
CO2: 27 mmol/L (ref 22–32)
Calcium: 9.6 mg/dL (ref 8.9–10.3)
Chloride: 103 mmol/L (ref 98–111)
Creatinine, Ser: 0.57 mg/dL (ref 0.44–1.00)
GFR calc Af Amer: >60 mL/min (ref >60)
GFR calc non Af Amer: >60 mL/min (ref >60)
Glucose, Bld: 114 mg/dL — ABNORMAL HIGH (ref 70–99)
Potassium: 4.4 mmol/L (ref 3.5–5.1)
Sodium: 138 mmol/L (ref 135–145)
Total Bilirubin: 0.7 mg/dL (ref 0.3–1.2)
Total Protein: 7.4 g/dL (ref 6.5–8.1)

## 2019-09-05 LAB — CBC WITH DIFFERENTIAL/PLATELET
Abs Immature Granulocytes: 0.03 10*3/uL (ref 0.00–0.07)
Basophils Absolute: 0 10*3/uL (ref 0.0–0.1)
Basophils Relative: 0 %
Eosinophils Absolute: 0.1 10*3/uL (ref 0.0–0.5)
Eosinophils Relative: 2 %
HCT: 40.7 % (ref 36.0–46.0)
Hemoglobin: 13.2 g/dL (ref 12.0–15.0)
Immature Granulocytes: 1 %
Lymphocytes Relative: 38 %
Lymphs Abs: 2.3 10*3/uL (ref 0.7–4.0)
MCH: 27 pg (ref 26.0–34.0)
MCHC: 32.4 g/dL (ref 30.0–36.0)
MCV: 83.4 fL (ref 80.0–100.0)
Monocytes Absolute: 0.4 10*3/uL (ref 0.1–1.0)
Monocytes Relative: 6 %
Neutro Abs: 3.2 10*3/uL (ref 1.7–7.7)
Neutrophils Relative %: 53 %
Platelets: 220 10*3/uL (ref 150–400)
RBC: 4.88 MIL/uL (ref 3.87–5.11)
RDW: 12.7 % (ref 11.5–15.5)
WBC: 6.1 10*3/uL (ref 4.0–10.5)
nRBC: 0 % (ref 0.0–0.2)

## 2019-09-05 NOTE — Assessment & Plan Note (Addendum)
#  Stage II ER negative HER-2/neu negative left breast cancer status post chemotherapy [2012]; radiation. Nov 2020- Mammogram; STABLE.   # Trigger thumb- Dr.Menz; awaiting release.   # HTN 142/94-under control as per patient at home.  Continue home medications.  Question white coat hypertension. STABLE.  #Triple negative breast cancer less than 65 years of age BRCA 6 and 2- otherwise no family history of no breast cancers/ ovarian cancers.  Discussed regarding expanded testing rather than testing her daughter at this time.  Discussed regarding genetic counseling.  Patient interested.  We will make referral.  # DISPOSITION: Will call if CA-27-29 is abnormal. # genetics Counsellor- triple negative breast cancer # follow up in 12 months/ labs-cbc/cmp/ca-27-29/ Mammogram prior- Dr.B

## 2019-09-05 NOTE — Progress Notes (Signed)
[facility] OFFICE PROGRESS NOTE  Patient Care Team: Marinda Elk, MD as PCP - General (Physician Assistant)  Cancer Staging No matching staging information was found for the patient.   Oncology History Overview Note  # 2011- stage pII pT2 pN0 ( SN) M0  invasive ductal carcinoma of left breast , ER 0%, PR  1%, her2/neu not overexpressed who is s/p chemotherapy per protocol NSABP 47 Arm 1-A TC X 6 which she was randomized 08/27/10.  She has completed radiation to left breast per Dr. Baruch Gouty in June 2012  2.BRCA1 and BRCA2 mutations are not identified (December, 2014)  #Obstructive sleep apnea/CPAP 2019.   DIAGNOSIS: breast cancer  STAGE:   II     ;GOALS: cure  CURRENT/MOST RECENT THERAPY: surveillaince      Carcinoma of overlapping sites of left breast in female, estrogen receptor negative ([place])      INTERVAL HISTORY:  Kelly Green 63 y.o.  female pleasant patient above history of triple negative breast cancer stage II is here for follow-up.  Patient continues to have chronic right hip pain.  Otherwise not any worse.  No tingling or numbness.  No constipation or diarrhea.   Complains of trigger finger on the right side.  Awaiting evaluation with orthopedics.  Denies any chest pain; shortness of breath or cough.  Review of Systems  Constitutional: Negative for chills, diaphoresis, fever, malaise/fatigue and weight loss.  HENT: Negative for nosebleeds and sore throat.   Eyes: Negative for double vision.  Respiratory: Negative for cough, hemoptysis, sputum production, shortness of breath and wheezing.   Cardiovascular: Negative for chest pain, palpitations, orthopnea and leg swelling.  Gastrointestinal: Negative for abdominal pain, blood in stool, constipation, diarrhea, heartburn, melena, nausea and vomiting.  Genitourinary: Negative for dysuria, frequency and urgency.  Musculoskeletal: Positive for back pain and joint pain.  Skin: Negative.   Negative for itching and rash.  Neurological: Negative for dizziness, tingling, focal weakness, weakness and headaches.  Endo/Heme/Allergies: Does not bruise/bleed easily.  Psychiatric/Behavioral: Negative for depression. The patient is not nervous/anxious and does not have insomnia.      PAST MEDICAL HISTORY :  Past Medical History:  Diagnosis Date  . Anemia    iron def.anemia  . Breast cancer ([place]) 2011   left breast with lumpectomy, chemo and rad tx  . Hyperlipemia   . Hypertension   . Hyperthyroidism   . Personal history of chemotherapy   . Personal history of radiation therapy   . Scoliosis, adolescent acquired   . Vitamin B 12 deficiency     PAST SURGICAL HISTORY :   Past Surgical History:  Procedure Laterality Date  . APPENDECTOMY    . BREAST BIOPSY Left 2011   neg with concordant  . BREAST CYST ASPIRATION Bilateral    neg  . BREAST LUMPECTOMY Left 2011   with radiation and chemo  . COLONOSCOPY WITH PROPOFOL N/A 01/14/2017   Procedure: COLONOSCOPY WITH PROPOFOL;  Surgeon: Lollie Sails, MD;  Location: [facility] ENDOSCOPY;  Service: Endoscopy;  Laterality: N/A;  . Thyroid Goiter Removal  35 years agi    FAMILY HISTORY :   Family History  Problem Relation Age of Onset  . Breast cancer Neg Hx     SOCIAL HISTORY:   Social History   Tobacco Use  . Smoking status: Never Smoker  . Smokeless tobacco: Never Used  Substance Use Topics  . Alcohol use: No  . Drug use: No    ALLERGIES:  is  allergic to septra [sulfamethoxazole-trimethoprim].  MEDICATIONS:  Current Outpatient Medications  Medication Sig Dispense Refill  . amLODipine (NORVASC) 5 MG tablet Take 5 mg by mouth daily.     . Cholecalciferol (VITAMIN D-1000 MAX ST) 1000 UNITS tablet Take by mouth.    . ferrous gluconate (FERGON) 240 (27 FE) MG tablet Take 240 mg by mouth daily.     Marland Kitchen losartan-hydrochlorothiazide (HYZAAR) 100-25 MG tablet Take by mouth.    . Multiple Minerals-Vitamins (CALCIUM & VIT D3  BONE HEALTH PO) Take by mouth.    . Multiple Vitamin (MULTI-VITAMINS) TABS Take by mouth.    . Omega-3 Fatty Acids (FISH OIL) 1000 MG CAPS Take by mouth.    . fluticasone (FLONASE) 50 MCG/ACT nasal spray as needed.     Marland Kitchen losartan-hydrochlorothiazide (HYZAAR) 100-25 MG tablet Take by mouth.     No current facility-administered medications for this visit.    PHYSICAL EXAMINATION: ECOG PERFORMANCE STATUS: 0 - Asymptomatic  BP (!) 142/91 (BP Location: Left Arm, Patient Position: Sitting, Cuff Size: Normal)   Pulse 98   Temp 97.6 F (36.4 C) (Tympanic)   Wt 160 lb (72.6 kg)   BMI 30.23 kg/m   Filed Weights   09/05/19 1028  Weight: 160 lb (72.6 kg)    Physical Exam  Constitutional: She is oriented to person, place, and time and well-developed, well-nourished, and in no distress.  HENT:  Head: Normocephalic and atraumatic.  Mouth/Throat: Oropharynx is clear and moist. No oropharyngeal exudate.  Eyes: Pupils are equal, round, and reactive to light.  Cardiovascular: Normal rate and regular rhythm.  Pulmonary/Chest: Effort normal and breath sounds normal. No respiratory distress. She has no wheezes.  Abdominal: Soft. Bowel sounds are normal. She exhibits no distension and no mass. There is no abdominal tenderness. There is no rebound and no guarding.  Musculoskeletal:        General: No tenderness or edema. Normal range of motion.     Cervical back: Normal range of motion and neck supple.  Neurological: She is alert and oriented to person, place, and time.  Skin: Skin is warm.     Psychiatric: Affect normal.     LABORATORY DATA:  I have reviewed the data as listed    Component Value Date/Time   NA 138 09/05/2019 1015   NA 140 08/28/2014 1511   K 4.4 09/05/2019 1015   K 3.6 08/28/2014 1511   CL 103 09/05/2019 1015   CL 104 08/28/2014 1511   CO2 27 09/05/2019 1015   CO2 32 08/28/2014 1511   GLUCOSE 114 (H) 09/05/2019 1015   GLUCOSE 99 08/28/2014 1511   BUN 16 09/05/2019  1015   BUN 16 08/28/2014 1511   CREATININE 0.57 09/05/2019 1015   CREATININE 0.66 08/28/2014 1511   CALCIUM 9.6 09/05/2019 1015   CALCIUM 9.3 08/28/2014 1511   PROT 7.4 09/05/2019 1015   PROT 7.2 08/28/2014 1511   ALBUMIN 4.4 09/05/2019 1015   ALBUMIN 4.0 08/28/2014 1511   AST 22 09/05/2019 1015   AST 20 08/28/2014 1511   ALT 16 09/05/2019 1015   ALT 29 08/28/2014 1511   ALKPHOS 115 09/05/2019 1015   ALKPHOS 116 08/28/2014 1511   BILITOT 0.7 09/05/2019 1015   BILITOT 0.4 08/28/2014 1511   GFRNONAA >60 09/05/2019 1015   GFRNONAA >60 08/28/2014 1511   GFRNONAA >60 02/20/2014 1518   GFRAA >60 09/05/2019 1015   GFRAA >60 08/28/2014 1511   GFRAA >60 02/20/2014 1518    No results  found for: SPEP, UPEP  Lab Results  Component Value Date   WBC 6.1 09/05/2019   NEUTROABS 3.2 09/05/2019   HGB 13.2 09/05/2019   HCT 40.7 09/05/2019   MCV 83.4 09/05/2019   PLT 220 09/05/2019      Chemistry      Component Value Date/Time   NA 138 09/05/2019 1015   NA 140 08/28/2014 1511   K 4.4 09/05/2019 1015   K 3.6 08/28/2014 1511   CL 103 09/05/2019 1015   CL 104 08/28/2014 1511   CO2 27 09/05/2019 1015   CO2 32 08/28/2014 1511   BUN 16 09/05/2019 1015   BUN 16 08/28/2014 1511   CREATININE 0.57 09/05/2019 1015   CREATININE 0.66 08/28/2014 1511      Component Value Date/Time   CALCIUM 9.6 09/05/2019 1015   CALCIUM 9.3 08/28/2014 1511   ALKPHOS 115 09/05/2019 1015   ALKPHOS 116 08/28/2014 1511   AST 22 09/05/2019 1015   AST 20 08/28/2014 1511   ALT 16 09/05/2019 1015   ALT 29 08/28/2014 1511   BILITOT 0.7 09/05/2019 1015   BILITOT 0.4 08/28/2014 1511       RADIOGRAPHIC STUDIES: I have personally reviewed the radiological images as listed and agreed with the findings in the report. No results found.   ASSESSMENT & PLAN:  Carcinoma of overlapping sites of left breast in female, estrogen receptor negative ([place]) # Stage II ER negative HER-2/neu negative left breast cancer  status post chemotherapy [2012]; radiation. Nov 2020- Mammogram; STABLE.   # Trigger thumb- Dr.Menz; awaiting release.   # HTN 142/94-under control as per patient at home.  Continue home medications.  Question white coat hypertension. STABLE.  #Triple negative breast cancer less than 26 years of age BRCA 29 and 2- otherwise no family history of no breast cancers/ ovarian cancers.  Discussed regarding expanded testing rather than testing her daughter at this time.  Discussed regarding genetic counseling.  Patient interested.  We will make referral.  # DISPOSITION: Will call if CA-27-29 is abnormal. # genetics Counsellor- triple negative breast cancer # follow up in 12 months/ labs-cbc/cmp/ca-27-29/ Mammogram prior- Dr.B   Orders Placed This Encounter  Procedures  . MM 3D SCREEN BREAST BILATERAL    Standing Status:   Future    Standing Expiration Date:   11/05/2020    Order Specific Question:   Reason for Exam (SYMPTOM  OR DIAGNOSIS REQUIRED)    Answer:   history of breast cancer    Order Specific Question:   Preferred imaging location?    Answer:   [facility]  . CBC with Differential    Standing Status:   Future    Standing Expiration Date:   09/04/2020  . Comprehensive metabolic panel    Standing Status:   Future    Standing Expiration Date:   09/04/2020  . Cancer antigen 27.29    Standing Status:   Future    Standing Expiration Date:   09/04/2020  . Ambulatory referral to Genetics    Referral Priority:   Routine    Referral Type:   Consultation    Referral Reason:   Specialty Services Required    Number of Visits Requested:   1   All questions were answered. The patient knows to call the clinic with any problems, questions or concerns.      Cammie Sickle, MD 09/06/2019 7:05 AM

## 2019-09-06 LAB — CANCER ANTIGEN 27.29: CA 27.29: 25.5 U/mL (ref 0.0–38.6)

## 2019-09-27 DIAGNOSIS — G4733 Obstructive sleep apnea (adult) (pediatric): Secondary | ICD-10-CM | POA: Diagnosis not present

## 2019-10-17 DIAGNOSIS — E538 Deficiency of other specified B group vitamins: Secondary | ICD-10-CM | POA: Diagnosis not present

## 2019-10-28 DIAGNOSIS — G4733 Obstructive sleep apnea (adult) (pediatric): Secondary | ICD-10-CM | POA: Diagnosis not present

## 2019-11-12 ENCOUNTER — Encounter: Payer: BC Managed Care – PPO | Admitting: Licensed Clinical Social Worker

## 2019-11-19 DIAGNOSIS — E538 Deficiency of other specified B group vitamins: Secondary | ICD-10-CM | POA: Diagnosis not present

## 2019-11-21 ENCOUNTER — Other Ambulatory Visit: Payer: Self-pay

## 2019-11-21 ENCOUNTER — Other Ambulatory Visit: Payer: Self-pay | Admitting: Genetic Counselor

## 2019-11-21 ENCOUNTER — Inpatient Hospital Stay: Payer: PPO | Attending: Genetic Counselor | Admitting: Genetic Counselor

## 2019-11-21 ENCOUNTER — Encounter: Payer: Self-pay | Admitting: Genetic Counselor

## 2019-11-21 ENCOUNTER — Inpatient Hospital Stay: Payer: PPO

## 2019-11-21 DIAGNOSIS — Z8051 Family history of malignant neoplasm of kidney: Secondary | ICD-10-CM | POA: Diagnosis not present

## 2019-11-21 DIAGNOSIS — Z171 Estrogen receptor negative status [ER-]: Secondary | ICD-10-CM

## 2019-11-21 DIAGNOSIS — Z808 Family history of malignant neoplasm of other organs or systems: Secondary | ICD-10-CM | POA: Diagnosis not present

## 2019-11-21 DIAGNOSIS — C50812 Malignant neoplasm of overlapping sites of left female breast: Secondary | ICD-10-CM

## 2019-11-21 LAB — GENETIC SCREENING ORDER

## 2019-11-21 NOTE — Progress Notes (Signed)
REFERRING PROVIDER: Cammie Sickle, MD [place] [address]  PRIMARY PROVIDER:  Marinda Elk, MD  PRIMARY REASON FOR VISIT:  1. Carcinoma of overlapping sites of left breast in female, estrogen receptor negative ([place])   2. Family history of kidney cancer   3. Family history of brain cancer      HISTORY OF PRESENT ILLNESS:   Kelly Green, a 64 y.o. female, was seen for a [place] cancer genetics consultation at the request of Dr. Rogue Bussing due to a personal and family history of cancer.  Kelly Green presents to clinic today to discuss the possibility of a hereditary predisposition to cancer, genetic testing, and to further clarify her future cancer risks, as well as potential cancer risks for family members.   In 2011, at the age of 36, Kelly Green was diagnosed with triple negative breast cancer.  She reports being tested for BRCA mutations and is reportedly negative.     CANCER HISTORY:  Oncology History Overview Note  # 2011- stage pII pT2 pN0 ( SN) M0  invasive ductal carcinoma of left breast , ER 0%, PR  1%, her2/neu not overexpressed who is s/p chemotherapy per protocol NSABP 47 Arm 1-A TC X 6 which she was randomized 08/27/10.  She has completed radiation to left breast per Dr. Baruch Gouty in June 2012  2.BRCA1 and BRCA2 mutations are not identified (December, 2014)  #Obstructive sleep apnea/CPAP 2019.   DIAGNOSIS: breast cancer  STAGE:   II     ;GOALS: cure  CURRENT/MOST RECENT THERAPY: surveillaince      Carcinoma of overlapping sites of left breast in female, estrogen receptor negative ([place])     RISK FACTORS:  Menarche was at age 15.  First live birth at age 6.  OCP use for approximately 2 years.  Ovaries intact: yes.  Hysterectomy: no.  Menopausal status: postmenopausal.  HRT use: 0 years. Colonoscopy: yes; ~5 polyps. Mammogram within the last year: yes. Number of breast biopsies: 1. Up to date with pelvic exams: yes. Any  excessive radiation exposure in the past: no  Past Medical History:  Diagnosis Date  . Anemia    iron def.anemia  . Breast cancer ([place]) 2011   left breast with lumpectomy, chemo and rad tx  . Family history of brain cancer   . Family history of kidney cancer   . Hyperlipemia   . Hypertension   . Hyperthyroidism   . Personal history of chemotherapy   . Personal history of radiation therapy   . Scoliosis, adolescent acquired   . Vitamin B 12 deficiency     Past Surgical History:  Procedure Laterality Date  . APPENDECTOMY    . BREAST BIOPSY Left 2011   neg with concordant  . BREAST CYST ASPIRATION Bilateral    neg  . BREAST LUMPECTOMY Left 2011   with radiation and chemo  . COLONOSCOPY WITH PROPOFOL N/A 01/14/2017   Procedure: COLONOSCOPY WITH PROPOFOL;  Surgeon: Lollie Sails, MD;  Location: [facility] ENDOSCOPY;  Service: Endoscopy;  Laterality: N/A;  . Thyroid Goiter Removal  35 years agi    Social History   Socioeconomic History  . Marital status: Married    Spouse name: Not on file  . Number of children: Not on file  . Years of education: Not on file  . Highest education level: Not on file  Occupational History  . Not on file  Tobacco Use  . Smoking status: Never Smoker  . Smokeless  tobacco: Never Used  Substance and Sexual Activity  . Alcohol use: No  . Drug use: No  . Sexual activity: Not on file  Other Topics Concern  . Not on file  Social History Narrative  . Not on file   Social Determinants of Health   Financial Resource Strain:   . Difficulty of Paying Living Expenses: Not on file  Food Insecurity:   . Worried About Charity fundraiser in the Last Year: Not on file  . Ran Out of Food in the Last Year: Not on file  Transportation Needs:   . Lack of Transportation (Medical): Not on file  . Lack of Transportation (Non-Medical): Not on file  Physical Activity:   . Days of Exercise per Week: Not on file  . Minutes of Exercise per Session: Not on  file  Stress:   . Feeling of Stress : Not on file  Social Connections:   . Frequency of Communication with Friends and Family: Not on file  . Frequency of Social Gatherings with Friends and Family: Not on file  . Attends Religious Services: Not on file  . Active Member of Clubs or Organizations: Not on file  . Attends Archivist Meetings: Not on file  . Marital Status: Not on file     FAMILY HISTORY:  We obtained a detailed, 4-generation family history.  Significant diagnoses are listed below: Family History  Problem Relation Age of Onset  . Dementia Mother   . Heart attack Father 78  . Heart attack Maternal Aunt   . Heart attack Maternal Grandmother   . Dementia Maternal Grandfather   . Brain cancer Cousin 17       pat first cousin  . Cirrhosis Cousin        2 pat first cousins  . Liver cancer Cousin        pat first cousin  . Kidney cancer Cousin        pat first cousin  . Breast cancer Cousin 23       mat first cousin's daughter    The patient has a son and daughter who are cancer free. She has one sister who is cancer free.  Both parents are deceased.  The patient's mother died from complications of dementia.  She had one sister who died of a heart attack.  She has a granddaughter who had breast cancer at 56.  The maternal grandparents passed away from non-cancer related issues.  The patient's father died of a heart attack.  He had 8 siblings, none who reportedly had cancer.  One brother had a son with a brain tumor who died at 55, a few sisters had children with cancer, one cousin had liver cancer, two had liver cirrhosis, two had lung cancer, one had Hodgkin's lymphoma and another with kidney cancer.  Kelly Green is unaware of previous family history of genetic testing for hereditary cancer risks. Patient's maternal ancestors are of [place] descent, and paternal ancestors are of Caucasian descent. There is no reported Ashkenazi Jewish ancestry. There is no known  consanguinity.  GENETIC COUNSELING ASSESSMENT: Kelly Green is a 64 y.o. female with a personal and family history of cancer which is somewhat suggestive of a hereditary cancer syndrome and predisposition to cancer given her triple negative cancer under age 42. We, therefore, discussed and recommended the following at today's visit.   DISCUSSION: We discussed that 5 - 10% of breast cancer is hereditary, with most cases associated with BRCA mutations.  There are other genes that can be associated with hereditary breast cancer syndromes.  These include ATM, CHEK2 and PALB2.   We discussed that testing is beneficial for several reasons including knowing how to follow individuals after completing their treatment, identifying whether potential treatment options such as PARP inhibitors would be beneficial, and understand if other family members could be at risk for cancer and allow them to undergo genetic testing.   We reviewed the characteristics, features and inheritance patterns of hereditary cancer syndromes. We also discussed genetic testing, including the appropriate family members to test, the process of testing, insurance coverage and turn-around-time for results. We discussed the implications of a negative, positive, carrier and/or variant of uncertain significant result. We recommended Kelly Green pursue genetic testing for the common hereditary gene panel. The Common Hereditary Gene Panel offered by [facility] includes sequencing and/or deletion duplication testing of the following 48 genes: APC, ATM, AXIN2, BARD1, BMPR1A, BRCA1, BRCA2, BRIP1, CDH1, CDK4, CDKN2A (p14ARF), CDKN2A (p16INK4a), CHEK2, CTNNA1, DICER1, EPCAM (Deletion/duplication testing only), GREM1 (promoter region deletion/duplication testing only), KIT, MEN1, MLH1, MSH2, MSH3, MSH6, MUTYH, NBN, NF1, NHTL1, PALB2, PDGFRA, PMS2, POLD1, POLE, PTEN, RAD50, RAD51C, RAD51D, RNF43, SDHB, SDHC, SDHD, SMAD4, SMARCA4. STK11, TP53, TSC1, TSC2, and VHL.  The  following genes were evaluated for sequence changes only: SDHA and HOXB13 c.251G>A variant only.   Based on Kelly Green's personal and family history of cancer, she meets medical criteria for genetic testing. Despite that she meets criteria, she may still have an out of pocket cost. We discussed that if her out of pocket cost for testing is over $100, the laboratory will call and confirm whether she wants to proceed with testing.  If the out of pocket cost of testing is less than $100 she will be billed by the genetic testing laboratory.   PLAN: After considering the risks, benefits, and limitations, Kelly Green provided informed consent to pursue genetic testing and the blood sample was sent to [facility] for analysis of the common hereditary cancer panel. Results should be available within approximately 2-3 weeks' time, at which point they will be disclosed by telephone to Kelly Green, as will any additional recommendations warranted by these results. Kelly Green will receive a summary of her genetic counseling visit and a copy of her results once available. This information will also be available in Epic.   Lastly, we encouraged Kelly Green to remain in contact with cancer genetics annually so that we can continuously update the family history and inform her of any changes in cancer genetics and testing that may be of benefit for this family.   Kelly Green questions were answered to her satisfaction today. Our contact information was provided should additional questions or concerns arise. Thank you for the referral and allowing [place] to share in the care of your patient.   Marquise Lambson P. Florene Glen, [place], [facility] Licensed, Insurance risk surveyor [email or web address] phone: [phone]  The patient was seen for a total of 45 minutes in face-to-face genetic counseling.  This patient was discussed with Drs. Magrinat, Lindi Adie and/or Burr Medico who agrees with the above.     _______________________________________________________________________ For Office Staff:  Number of people involved in session: 1 Was an Intern/ student involved with case: no

## 2019-11-25 DIAGNOSIS — G4733 Obstructive sleep apnea (adult) (pediatric): Secondary | ICD-10-CM | POA: Diagnosis not present

## 2019-12-03 DIAGNOSIS — I10 Essential (primary) hypertension: Secondary | ICD-10-CM | POA: Diagnosis not present

## 2019-12-03 DIAGNOSIS — E782 Mixed hyperlipidemia: Secondary | ICD-10-CM | POA: Diagnosis not present

## 2019-12-10 DIAGNOSIS — E538 Deficiency of other specified B group vitamins: Secondary | ICD-10-CM | POA: Diagnosis not present

## 2019-12-10 DIAGNOSIS — C50812 Malignant neoplasm of overlapping sites of left female breast: Secondary | ICD-10-CM | POA: Diagnosis not present

## 2019-12-10 DIAGNOSIS — Z0000 Encounter for general adult medical examination without abnormal findings: Secondary | ICD-10-CM | POA: Diagnosis not present

## 2019-12-10 DIAGNOSIS — Z171 Estrogen receptor negative status [ER-]: Secondary | ICD-10-CM | POA: Diagnosis not present

## 2019-12-10 DIAGNOSIS — E782 Mixed hyperlipidemia: Secondary | ICD-10-CM | POA: Diagnosis not present

## 2019-12-10 DIAGNOSIS — Z9989 Dependence on other enabling machines and devices: Secondary | ICD-10-CM | POA: Diagnosis not present

## 2019-12-10 DIAGNOSIS — I10 Essential (primary) hypertension: Secondary | ICD-10-CM | POA: Diagnosis not present

## 2019-12-10 DIAGNOSIS — G4733 Obstructive sleep apnea (adult) (pediatric): Secondary | ICD-10-CM | POA: Diagnosis not present

## 2019-12-14 ENCOUNTER — Encounter: Payer: Self-pay | Admitting: Genetic Counselor

## 2019-12-14 ENCOUNTER — Telehealth: Payer: Self-pay | Admitting: Genetic Counselor

## 2019-12-14 ENCOUNTER — Ambulatory Visit: Payer: Self-pay | Admitting: Genetic Counselor

## 2019-12-14 DIAGNOSIS — Z1379 Encounter for other screening for genetic and chromosomal anomalies: Secondary | ICD-10-CM | POA: Insufficient documentation

## 2019-12-14 NOTE — Telephone Encounter (Signed)
Revealed negative genetic testing.  Discussed that we do not know why she has breast cancer or why there is cancer in the family. It could be due to a different gene that we are not testing, or maybe our current technology may not be able to pick something up.  It will be important for her to keep in contact with genetics to keep up with whether additional testing may be needed. 

## 2019-12-14 NOTE — Progress Notes (Signed)
HPI:  Ms. Jorden was previously seen in the [place] clinic due to a personal and family history of cancer and concerns regarding a hereditary predisposition to cancer. Please refer to our prior cancer genetics clinic note for more information regarding our discussion, assessment and recommendations, at the time. Ms. Antony recent genetic test results were disclosed to her, as were recommendations warranted by these results. These results and recommendations are discussed in more detail below.  CANCER HISTORY:  Oncology History Overview Note  # 2011- stage pII pT2 pN0 ( SN) M0  invasive ductal carcinoma of left breast , ER 0%, PR  1%, her2/neu not overexpressed who is s/p chemotherapy per protocol NSABP 47 Arm 1-A TC X 6 which she was randomized 08/27/10.  She has completed radiation to left breast per Dr. Baruch Gouty in June 2012  2.BRCA1 and BRCA2 mutations are not identified (December, 2014)  #Obstructive sleep apnea/CPAP 2019.   DIAGNOSIS: breast cancer  STAGE:   II     ;GOALS: cure  CURRENT/MOST RECENT THERAPY: surveillaince      Carcinoma of overlapping sites of left breast in female, estrogen receptor negative ([place])  12/05/2019 Genetic Testing   Negative genetic testing on the common hereditary cancer panel. The Common Hereditary Gene Panel offered by [facility] includes sequencing and/or deletion duplication testing of the following 48 genes: APC, ATM, AXIN2, BARD1, BMPR1A, BRCA1, BRCA2, BRIP1, CDH1, CDK4, CDKN2A (p14ARF), CDKN2A (p16INK4a), CHEK2, CTNNA1, DICER1, EPCAM (Deletion/duplication testing only), GREM1 (promoter region deletion/duplication testing only), KIT, MEN1, MLH1, MSH2, MSH3, MSH6, MUTYH, NBN, NF1, NHTL1, PALB2, PDGFRA, PMS2, POLD1, POLE, PTEN, RAD50, RAD51C, RAD51D, RNF43, SDHB, SDHC, SDHD, SMAD4, SMARCA4. STK11, TP53, TSC1, TSC2, and VHL.  The following genes were evaluated for sequence changes only: SDHA and HOXB13 c.251G>A variant only. The report date  is December 05, 2019.     FAMILY HISTORY:  We obtained a detailed, 4-generation family history.  Significant diagnoses are listed below: Family History  Problem Relation Age of Onset  . Dementia Mother   . Heart attack Father 80  . Heart attack Maternal Aunt   . Heart attack Maternal Grandmother   . Dementia Maternal Grandfather   . Brain cancer Cousin 17       pat first cousin  . Cirrhosis Cousin        2 pat first cousins  . Liver cancer Cousin        pat first cousin  . Kidney cancer Cousin        pat first cousin  . Breast cancer Cousin 63       mat first cousin's daughter    The patient has a son and daughter who are cancer free. She has one sister who is cancer free.  Both parents are deceased.  The patient's mother died from complications of dementia.  She had one sister who died of a heart attack.  She has a granddaughter who had breast cancer at 56.  The maternal grandparents passed away from non-cancer related issues.  The patient's father died of a heart attack.  He had 8 siblings, none who reportedly had cancer.  One brother had a son with a brain tumor who died at 88, a few sisters had children with cancer, one cousin had liver cancer, two had liver cirrhosis, two had lung cancer, one had Hodgkin's lymphoma and another with kidney cancer.  Ms. Alsobrook is unaware of previous family history of genetic testing for hereditary cancer risks. Patient's maternal ancestors are  of [place] descent, and paternal ancestors are of Caucasian descent. There is no reported Ashkenazi Jewish ancestry. There is no known consanguinity.    GENETIC TEST RESULTS: Genetic testing reported out on December 05, 2019 through the common hereditary cancer panel found no pathogenic mutations. The Common Hereditary Gene Panel offered by [facility] includes sequencing and/or deletion duplication testing of the following 48 genes: APC, ATM, AXIN2, BARD1, BMPR1A, BRCA1, BRCA2, BRIP1, CDH1, CDK4, CDKN2A (p14ARF),  CDKN2A (p16INK4a), CHEK2, CTNNA1, DICER1, EPCAM (Deletion/duplication testing only), GREM1 (promoter region deletion/duplication testing only), KIT, MEN1, MLH1, MSH2, MSH3, MSH6, MUTYH, NBN, NF1, NHTL1, PALB2, PDGFRA, PMS2, POLD1, POLE, PTEN, RAD50, RAD51C, RAD51D, RNF43, SDHB, SDHC, SDHD, SMAD4, SMARCA4. STK11, TP53, TSC1, TSC2, and VHL.  The following genes were evaluated for sequence changes only: SDHA and HOXB13 c.251G>A variant only. The test report has been scanned into EPIC and is located under the Molecular Pathology section of the Results Review tab.  A portion of the result report is included below for reference.     We discussed with Ms. Kincade that because current genetic testing is not perfect, it is possible there may be a gene mutation in one of these genes that current testing cannot detect, but that chance is small.  We also discussed, that there could be another gene that has not yet been discovered, or that we have not yet tested, that is responsible for the cancer diagnoses in the family. It is also possible there is a hereditary cause for the cancer in the family that Ms. Mayberry did not inherit and therefore was not identified in her testing.  Therefore, it is important to remain in touch with cancer genetics in the future so that we can continue to offer Ms. Knoles the most up to date genetic testing.   ADDITIONAL GENETIC TESTING: We discussed with Ms. Ricke that there are other genes that are associated with increased cancer risk that can be analyzed. Should Ms. Depner wish to pursue additional genetic testing, we are happy to discuss and coordinate this testing, at any time.    CANCER SCREENING RECOMMENDATIONS: Ms. Hord test result is considered negative (normal).  This means that we have not identified a hereditary cause for her personal and family history of cancer at this time. Most cancers happen by chance and this negative test suggests that her cancer may fall into this category.     While reassuring, this does not definitively rule out a hereditary predisposition to cancer. It is still possible that there could be genetic mutations that are undetectable by current technology. There could be genetic mutations in genes that have not been tested or identified to increase cancer risk.  Therefore, it is recommended she continue to follow the cancer management and screening guidelines provided by her oncology and primary healthcare provider.   An individual's cancer risk and medical management are not determined by genetic test results alone. Overall cancer risk assessment incorporates additional factors, including personal medical history, family history, and any available genetic information that may result in a personalized plan for cancer prevention and surveillance  RECOMMENDATIONS FOR FAMILY MEMBERS:  Individuals in this family might be at some increased risk of developing cancer, over the general population risk, simply due to the family history of cancer.  We recommended women in this family have a yearly mammogram beginning at age 78, or 59 years younger than the earliest onset of cancer, an annual clinical breast exam, and perform monthly breast self-exams. Women in this family  should also have a gynecological exam as recommended by their primary provider. All family members should have a colonoscopy by age 57.  FOLLOW-UP: Lastly, we discussed with Ms. Disla that cancer genetics is a rapidly advancing field and it is possible that new genetic tests will be appropriate for her and/or her family members in the future. We encouraged her to remain in contact with cancer genetics on an annual basis so we can update her personal and family histories and let her know of advances in cancer genetics that may benefit this family.   Our contact number was provided. Ms. Dolbow questions were answered to her satisfaction, and she knows she is welcome to call us at anytime with additional  questions or concerns.   Roma Kayser, [place], [facility] Licensed, Certified Genetic Counselor [email or web address]

## 2019-12-20 DIAGNOSIS — E538 Deficiency of other specified B group vitamins: Secondary | ICD-10-CM | POA: Diagnosis not present

## 2019-12-26 DIAGNOSIS — G4733 Obstructive sleep apnea (adult) (pediatric): Secondary | ICD-10-CM | POA: Diagnosis not present

## 2020-01-15 DIAGNOSIS — L02415 Cutaneous abscess of right lower limb: Secondary | ICD-10-CM | POA: Diagnosis not present

## 2020-01-17 DIAGNOSIS — L02415 Cutaneous abscess of right lower limb: Secondary | ICD-10-CM | POA: Diagnosis not present

## 2020-01-22 DIAGNOSIS — E538 Deficiency of other specified B group vitamins: Secondary | ICD-10-CM | POA: Diagnosis not present

## 2020-02-25 DIAGNOSIS — E538 Deficiency of other specified B group vitamins: Secondary | ICD-10-CM | POA: Diagnosis not present

## 2020-04-02 DIAGNOSIS — E538 Deficiency of other specified B group vitamins: Secondary | ICD-10-CM | POA: Diagnosis not present

## 2020-05-14 DIAGNOSIS — E538 Deficiency of other specified B group vitamins: Secondary | ICD-10-CM | POA: Diagnosis not present

## 2020-06-04 DIAGNOSIS — M4605 Spinal enthesopathy, thoracolumbar region: Secondary | ICD-10-CM | POA: Diagnosis not present

## 2020-06-04 DIAGNOSIS — M9902 Segmental and somatic dysfunction of thoracic region: Secondary | ICD-10-CM | POA: Diagnosis not present

## 2020-06-04 DIAGNOSIS — M4607 Spinal enthesopathy, lumbosacral region: Secondary | ICD-10-CM | POA: Diagnosis not present

## 2020-06-04 DIAGNOSIS — M9904 Segmental and somatic dysfunction of sacral region: Secondary | ICD-10-CM | POA: Diagnosis not present

## 2020-06-04 DIAGNOSIS — M9903 Segmental and somatic dysfunction of lumbar region: Secondary | ICD-10-CM | POA: Diagnosis not present

## 2020-06-05 DIAGNOSIS — E782 Mixed hyperlipidemia: Secondary | ICD-10-CM | POA: Diagnosis not present

## 2020-06-05 DIAGNOSIS — I10 Essential (primary) hypertension: Secondary | ICD-10-CM | POA: Diagnosis not present

## 2020-06-05 DIAGNOSIS — Z0000 Encounter for general adult medical examination without abnormal findings: Secondary | ICD-10-CM | POA: Diagnosis not present

## 2020-06-12 DIAGNOSIS — Z1231 Encounter for screening mammogram for malignant neoplasm of breast: Secondary | ICD-10-CM | POA: Diagnosis not present

## 2020-06-12 DIAGNOSIS — Z0000 Encounter for general adult medical examination without abnormal findings: Secondary | ICD-10-CM | POA: Diagnosis not present

## 2020-06-12 DIAGNOSIS — Z171 Estrogen receptor negative status [ER-]: Secondary | ICD-10-CM | POA: Diagnosis not present

## 2020-06-12 DIAGNOSIS — C50812 Malignant neoplasm of overlapping sites of left female breast: Secondary | ICD-10-CM | POA: Diagnosis not present

## 2020-06-12 DIAGNOSIS — M5136 Other intervertebral disc degeneration, lumbar region: Secondary | ICD-10-CM | POA: Diagnosis not present

## 2020-06-12 DIAGNOSIS — Z23 Encounter for immunization: Secondary | ICD-10-CM | POA: Diagnosis not present

## 2020-06-12 DIAGNOSIS — I10 Essential (primary) hypertension: Secondary | ICD-10-CM | POA: Diagnosis not present

## 2020-06-12 DIAGNOSIS — E538 Deficiency of other specified B group vitamins: Secondary | ICD-10-CM | POA: Diagnosis not present

## 2020-06-12 DIAGNOSIS — R072 Precordial pain: Secondary | ICD-10-CM | POA: Diagnosis not present

## 2020-06-12 DIAGNOSIS — E782 Mixed hyperlipidemia: Secondary | ICD-10-CM | POA: Diagnosis not present

## 2020-06-12 DIAGNOSIS — G4733 Obstructive sleep apnea (adult) (pediatric): Secondary | ICD-10-CM | POA: Diagnosis not present

## 2020-06-12 DIAGNOSIS — Z780 Asymptomatic menopausal state: Secondary | ICD-10-CM | POA: Diagnosis not present

## 2020-06-16 ENCOUNTER — Other Ambulatory Visit: Payer: Self-pay | Admitting: Physician Assistant

## 2020-06-18 DIAGNOSIS — E538 Deficiency of other specified B group vitamins: Secondary | ICD-10-CM | POA: Diagnosis not present

## 2020-06-18 DIAGNOSIS — M8588 Other specified disorders of bone density and structure, other site: Secondary | ICD-10-CM | POA: Diagnosis not present

## 2020-07-23 DIAGNOSIS — E538 Deficiency of other specified B group vitamins: Secondary | ICD-10-CM | POA: Diagnosis not present

## 2020-07-31 ENCOUNTER — Other Ambulatory Visit: Payer: Self-pay

## 2020-07-31 ENCOUNTER — Ambulatory Visit
Admission: RE | Admit: 2020-07-31 | Discharge: 2020-07-31 | Disposition: A | Discharge status: Home / Self Care | Payer: PPO | Source: Ambulatory Visit | Attending: Internal Medicine | Admitting: Internal Medicine

## 2020-07-31 DIAGNOSIS — Z171 Estrogen receptor negative status [ER-]: Secondary | ICD-10-CM

## 2020-07-31 DIAGNOSIS — Z1231 Encounter for screening mammogram for malignant neoplasm of breast: Secondary | ICD-10-CM | POA: Insufficient documentation

## 2020-07-31 DIAGNOSIS — Z853 Personal history of malignant neoplasm of breast: Secondary | ICD-10-CM | POA: Insufficient documentation

## 2020-08-28 DIAGNOSIS — E538 Deficiency of other specified B group vitamins: Secondary | ICD-10-CM | POA: Diagnosis not present

## 2020-09-04 ENCOUNTER — Encounter: Payer: Self-pay | Admitting: Internal Medicine

## 2020-09-04 ENCOUNTER — Inpatient Hospital Stay: Payer: PPO | Attending: Internal Medicine | Admitting: Internal Medicine

## 2020-09-04 DIAGNOSIS — E785 Hyperlipidemia, unspecified: Secondary | ICD-10-CM | POA: Diagnosis not present

## 2020-09-04 DIAGNOSIS — Z808 Family history of malignant neoplasm of other organs or systems: Secondary | ICD-10-CM | POA: Insufficient documentation

## 2020-09-04 DIAGNOSIS — Z171 Estrogen receptor negative status [ER-]: Secondary | ICD-10-CM

## 2020-09-04 DIAGNOSIS — I10 Essential (primary) hypertension: Secondary | ICD-10-CM | POA: Diagnosis not present

## 2020-09-04 DIAGNOSIS — E538 Deficiency of other specified B group vitamins: Secondary | ICD-10-CM | POA: Insufficient documentation

## 2020-09-04 DIAGNOSIS — Z923 Personal history of irradiation: Secondary | ICD-10-CM | POA: Insufficient documentation

## 2020-09-04 DIAGNOSIS — M2550 Pain in unspecified joint: Secondary | ICD-10-CM | POA: Diagnosis not present

## 2020-09-04 DIAGNOSIS — G4730 Sleep apnea, unspecified: Secondary | ICD-10-CM | POA: Insufficient documentation

## 2020-09-04 DIAGNOSIS — Z9221 Personal history of antineoplastic chemotherapy: Secondary | ICD-10-CM | POA: Insufficient documentation

## 2020-09-04 DIAGNOSIS — M8580 Other specified disorders of bone density and structure, unspecified site: Secondary | ICD-10-CM | POA: Diagnosis not present

## 2020-09-04 DIAGNOSIS — M419 Scoliosis, unspecified: Secondary | ICD-10-CM | POA: Insufficient documentation

## 2020-09-04 DIAGNOSIS — C50812 Malignant neoplasm of overlapping sites of left female breast: Secondary | ICD-10-CM | POA: Diagnosis not present

## 2020-09-04 DIAGNOSIS — Z79899 Other long term (current) drug therapy: Secondary | ICD-10-CM | POA: Insufficient documentation

## 2020-09-04 DIAGNOSIS — Z8051 Family history of malignant neoplasm of kidney: Secondary | ICD-10-CM | POA: Diagnosis not present

## 2020-09-04 NOTE — Assessment & Plan Note (Addendum)
#  Stage II ER negative HER-2/neu negative left breast cancer status post chemotherapy [2012]; radiation. Nov 2021- Mammogram; STABLE.  Again reviewed that patient is essentially cured of her breast cancer.  However need to continue surveillance of new breast cancers.  # OCT 2021 BMD [PCP]- OSTEOPENIA- Discussed the potential risk factors for osteoporosis- age/gender/postmenopausal status. Discussed multiple options including exercise/ calcium and vitamin D supplementation/ and also use of bisphosphonates. Discussed oral bisphosphonates versus parenteral bisphosphonate like Reclast.. Discussed the potential benefits and/side effects  Including but not limited to Osteonecrosis of jaw/ hypocalcemia. Pt wants holistic approach; will wait to repeat BMD with PCP in 2 years.  If BMD shows progression/osteoporosis patient is then inclinedto proceed with Reclast.    # HTN 142/94-under control as per patient at home.  Continue home medications.  Question white coat hypertension. STABLE.  #Triple negative breast cancer- NEG genetics work up.   # DISPOSITION:  # follow up in 12 months/ labs-cbc/cmp/ca-27-29/ Mammogram prior- Dr.B  Cc; Ms.McLaughlin.

## 2020-09-04 NOTE — Progress Notes (Signed)
[place] OFFICE PROGRESS NOTE  Patient Care Team: Marinda Elk, MD as PCP - General (Physician Assistant)  Cancer Staging No matching staging information was found for the patient.   Oncology History Overview Note  # 2011- stage pII pT2 pN0 ( SN) M0  invasive ductal carcinoma of left breast , ER 0%, PR  1%, her2/neu not overexpressed who is s/p chemotherapy per protocol NSABP 47 Arm 1-A TC X 6 which she was randomized 08/27/10.  She has completed radiation to left breast per Dr. Baruch Gouty in June 2012  2.BRCA1 and BRCA2 mutations are not identified (December, 2014)  #Obstructive sleep apnea/CPAP 2019.   DIAGNOSIS: breast cancer  STAGE:   II     ;GOALS: cure  CURRENT/MOST RECENT THERAPY: surveillaince      Carcinoma of overlapping sites of left breast in female, estrogen receptor negative ([place])  12/05/2019 Genetic Testing   Negative genetic testing on the common hereditary cancer panel. The Common Hereditary Gene Panel offered by [facility] includes sequencing and/or deletion duplication testing of the following 48 genes: APC, ATM, AXIN2, BARD1, BMPR1A, BRCA1, BRCA2, BRIP1, CDH1, CDK4, CDKN2A (p14ARF), CDKN2A (p16INK4a), CHEK2, CTNNA1, DICER1, EPCAM (Deletion/duplication testing only), GREM1 (promoter region deletion/duplication testing only), KIT, MEN1, MLH1, MSH2, MSH3, MSH6, MUTYH, NBN, NF1, NHTL1, PALB2, PDGFRA, PMS2, POLD1, POLE, PTEN, RAD50, RAD51C, RAD51D, RNF43, SDHB, SDHC, SDHD, SMAD4, SMARCA4. STK11, TP53, TSC1, TSC2, and VHL.  The following genes were evaluated for sequence changes only: SDHA and HOXB13 c.251G>A variant only. The report date is December 05, 2019.       INTERVAL HISTORY:  Kelly Green 64 y.o.  female pleasant patient above history of triple negative breast cancer stage II is here for follow-up.  In the interim patient was evaluated by genetic counseling.  Patient had genetic test drawn-negative for any deleterious  mutation.  Continues to chronic joint pains not any worse.  No new shortness of breath or cough.  Review of Systems  Constitutional: Negative for chills, diaphoresis, fever, malaise/fatigue and weight loss.  HENT: Negative for nosebleeds and sore throat.   Eyes: Negative for double vision.  Respiratory: Negative for cough, hemoptysis, sputum production, shortness of breath and wheezing.   Cardiovascular: Negative for chest pain, palpitations, orthopnea and leg swelling.  Gastrointestinal: Negative for abdominal pain, blood in stool, constipation, diarrhea, heartburn, melena, nausea and vomiting.  Genitourinary: Negative for dysuria, frequency and urgency.  Musculoskeletal: Positive for back pain and joint pain.  Skin: Negative.  Negative for itching and rash.  Neurological: Negative for dizziness, tingling, focal weakness, weakness and headaches.  Endo/Heme/Allergies: Does not bruise/bleed easily.  Psychiatric/Behavioral: Negative for depression. The patient is not nervous/anxious and does not have insomnia.      PAST MEDICAL HISTORY :  Past Medical History:  Diagnosis Date  . Anemia    iron def.anemia  . Breast cancer ([place]) 2011   left breast with lumpectomy, chemo and rad tx  . Family history of brain cancer   . Family history of kidney cancer   . Hyperlipemia   . Hypertension   . Hyperthyroidism   . Personal history of chemotherapy   . Personal history of radiation therapy   . Scoliosis, adolescent acquired   . Vitamin B 12 deficiency     PAST SURGICAL HISTORY :   Past Surgical History:  Procedure Laterality Date  . APPENDECTOMY    . BREAST BIOPSY Left 2011   neg with concordant  . BREAST CYST ASPIRATION Bilateral  neg  . BREAST LUMPECTOMY Left 2011   with radiation and chemo  . COLONOSCOPY WITH PROPOFOL N/A 01/14/2017   Procedure: COLONOSCOPY WITH PROPOFOL;  Surgeon: Lollie Sails, MD;  Location: [facility] ENDOSCOPY;  Service: Endoscopy;  Laterality: N/A;  .  Thyroid Goiter Removal  35 years agi    FAMILY HISTORY :   Family History  Problem Relation Age of Onset  . Dementia Mother   . Heart attack Father 43  . Heart attack Maternal Aunt   . Heart attack Maternal Grandmother   . Dementia Maternal Grandfather   . Brain cancer Cousin 17       pat first cousin  . Cirrhosis Cousin        2 pat first cousins  . Liver cancer Cousin        pat first cousin  . Kidney cancer Cousin        pat first cousin  . Breast cancer Cousin 28       mat first cousin's daughter    SOCIAL HISTORY:   Social History   Tobacco Use  . Smoking status: Never Smoker  . Smokeless tobacco: Never Used  Substance Use Topics  . Alcohol use: No  . Drug use: No    ALLERGIES:  is allergic to septra [sulfamethoxazole-trimethoprim].  MEDICATIONS:  Current Outpatient Medications  Medication Sig Dispense Refill  . amLODipine (NORVASC) 5 MG tablet Take 5 mg by mouth daily.     . Cholecalciferol 25 MCG (1000 UT) tablet Take by mouth.    . ferrous gluconate (FERGON) 240 (27 FE) MG tablet Take 240 mg by mouth daily.     . hydrochlorothiazide (HYDRODIURIL) 25 MG tablet Take 1 tablet by mouth daily.    Marland Kitchen losartan (COZAAR) 100 MG tablet Take 1 tablet by mouth daily.    . Multiple Minerals-Vitamins (CALCIUM & VIT D3 BONE HEALTH PO) Take by mouth.    . Multiple Vitamin (MULTI-VITAMINS) TABS Take by mouth.    . Omega-3 Fatty Acids (FISH OIL) 1000 MG CAPS Take by mouth.    Marland Kitchen omeprazole (PRILOSEC) 20 MG capsule Take by mouth.     No current facility-administered medications for this visit.    PHYSICAL EXAMINATION: ECOG PERFORMANCE STATUS: 0 - Asymptomatic  BP (!) 151/94 (BP Location: Left Arm, Patient Position: Sitting, Cuff Size: Normal)   Pulse 81   Temp (!) 96.9 F (36.1 C) (Tympanic)   Resp 16   Ht 5' 1" (1.549 m)   Wt 159 lb 9.6 oz (72.4 kg)   SpO2 100%   BMI 30.16 kg/m   Filed Weights   09/04/20 0911  Weight: 159 lb 9.6 oz (72.4 kg)    Physical  Exam HENT:     Head: Normocephalic and atraumatic.     Mouth/Throat:     Pharynx: No oropharyngeal exudate.  Eyes:     Pupils: Pupils are equal, round, and reactive to light.  Cardiovascular:     Rate and Rhythm: Normal rate and regular rhythm.  Pulmonary:     Effort: Pulmonary effort is normal. No respiratory distress.     Breath sounds: Normal breath sounds. No wheezing.  Abdominal:     General: Bowel sounds are normal. There is no distension.     Palpations: Abdomen is soft. There is no mass.     Tenderness: There is no abdominal tenderness. There is no guarding or rebound.  Musculoskeletal:        General: No tenderness. Normal range of motion.  Cervical back: Normal range of motion and neck supple.  Skin:    General: Skin is warm.     Comments:    Neurological:     Mental Status: She is alert and oriented to person, place, and time.  Psychiatric:        Mood and Affect: Affect normal.      LABORATORY DATA:  I have reviewed the data as listed    Component Value Date/Time   NA 138 09/05/2019 1015   NA 140 08/28/2014 1511   K 4.4 09/05/2019 1015   K 3.6 08/28/2014 1511   CL 103 09/05/2019 1015   CL 104 08/28/2014 1511   CO2 27 09/05/2019 1015   CO2 32 08/28/2014 1511   GLUCOSE 114 (H) 09/05/2019 1015   GLUCOSE 99 08/28/2014 1511   BUN 16 09/05/2019 1015   BUN 16 08/28/2014 1511   CREATININE 0.57 09/05/2019 1015   CREATININE 0.66 08/28/2014 1511   CALCIUM 9.6 09/05/2019 1015   CALCIUM 9.3 08/28/2014 1511   PROT 7.4 09/05/2019 1015   PROT 7.2 08/28/2014 1511   ALBUMIN 4.4 09/05/2019 1015   ALBUMIN 4.0 08/28/2014 1511   AST 22 09/05/2019 1015   AST 20 08/28/2014 1511   ALT 16 09/05/2019 1015   ALT 29 08/28/2014 1511   ALKPHOS 115 09/05/2019 1015   ALKPHOS 116 08/28/2014 1511   BILITOT 0.7 09/05/2019 1015   BILITOT 0.4 08/28/2014 1511   GFRNONAA >60 09/05/2019 1015   GFRNONAA >60 08/28/2014 1511   GFRNONAA >60 02/20/2014 1518   GFRAA >60 09/05/2019  1015   GFRAA >60 08/28/2014 1511   GFRAA >60 02/20/2014 1518    No results found for: SPEP, UPEP  Lab Results  Component Value Date   WBC 6.1 09/05/2019   NEUTROABS 3.2 09/05/2019   HGB 13.2 09/05/2019   HCT 40.7 09/05/2019   MCV 83.4 09/05/2019   PLT 220 09/05/2019      Chemistry      Component Value Date/Time   NA 138 09/05/2019 1015   NA 140 08/28/2014 1511   K 4.4 09/05/2019 1015   K 3.6 08/28/2014 1511   CL 103 09/05/2019 1015   CL 104 08/28/2014 1511   CO2 27 09/05/2019 1015   CO2 32 08/28/2014 1511   BUN 16 09/05/2019 1015   BUN 16 08/28/2014 1511   CREATININE 0.57 09/05/2019 1015   CREATININE 0.66 08/28/2014 1511      Component Value Date/Time   CALCIUM 9.6 09/05/2019 1015   CALCIUM 9.3 08/28/2014 1511   ALKPHOS 115 09/05/2019 1015   ALKPHOS 116 08/28/2014 1511   AST 22 09/05/2019 1015   AST 20 08/28/2014 1511   ALT 16 09/05/2019 1015   ALT 29 08/28/2014 1511   BILITOT 0.7 09/05/2019 1015   BILITOT 0.4 08/28/2014 1511       RADIOGRAPHIC STUDIES: I have personally reviewed the radiological images as listed and agreed with the findings in the report. No results found.   ASSESSMENT & PLAN:  Carcinoma of overlapping sites of left breast in female, estrogen receptor negative ([place]) # Stage II ER negative HER-2/neu negative left breast cancer status post chemotherapy [2012]; radiation. Nov 2021- Mammogram; STABLE.  Again reviewed that patient is essentially cured of her breast cancer.  However need to continue surveillance of new breast cancers.  # OCT 2021 BMD [PCP]- OSTEOPENIA- Discussed the potential risk factors for osteoporosis- age/gender/postmenopausal status. Discussed multiple options including exercise/ calcium and vitamin D supplementation/ and also  use of bisphosphonates. Discussed oral bisphosphonates versus parenteral bisphosphonate like Reclast.. Discussed the potential benefits and/side effects  Including but not limited to Osteonecrosis of  jaw/ hypocalcemia. Pt wants holistic approach; will wait to repeat BMD with PCP in 2 years.  If BMD shows progression/osteoporosis patient is then inclinedto proceed with Reclast.    # HTN 142/94-under control as per patient at home.  Continue home medications.  Question white coat hypertension. STABLE.  #Triple negative breast cancer- NEG genetics work up.   # DISPOSITION:  # follow up in 12 months/ labs-cbc/cmp/ca-27-29/ Mammogram prior- Dr.B   Orders Placed This Encounter  Procedures  . MM 3D SCREEN BREAST BILATERAL    Standing Status:   Future    Standing Expiration Date:   09/04/2021    Order Specific Question:   Reason for Exam (SYMPTOM  OR DIAGNOSIS REQUIRED)    Answer:   breast cancer    Order Specific Question:   Preferred imaging location?    Answer:   [facility]  . CBC with Differential/Platelet    Standing Status:   Future    Standing Expiration Date:   09/04/2021  . Comprehensive metabolic panel    Standing Status:   Future    Standing Expiration Date:   09/04/2021  . Cancer antigen 27.29    Standing Status:   Future    Standing Expiration Date:   09/04/2021   All questions were answered. The patient knows to call the clinic with any problems, questions or concerns.      Cammie Sickle, MD 09/04/2020 4:49 PM

## 2020-09-04 NOTE — Patient Instructions (Signed)
#   continue ca+vitD as taking.

## 2020-09-30 DIAGNOSIS — E538 Deficiency of other specified B group vitamins: Secondary | ICD-10-CM | POA: Diagnosis not present

## 2020-10-29 DIAGNOSIS — M9904 Segmental and somatic dysfunction of sacral region: Secondary | ICD-10-CM | POA: Diagnosis not present

## 2020-10-29 DIAGNOSIS — M9901 Segmental and somatic dysfunction of cervical region: Secondary | ICD-10-CM | POA: Diagnosis not present

## 2020-10-29 DIAGNOSIS — M5387 Other specified dorsopathies, lumbosacral region: Secondary | ICD-10-CM | POA: Diagnosis not present

## 2020-10-29 DIAGNOSIS — M9902 Segmental and somatic dysfunction of thoracic region: Secondary | ICD-10-CM | POA: Diagnosis not present

## 2020-10-29 DIAGNOSIS — M9903 Segmental and somatic dysfunction of lumbar region: Secondary | ICD-10-CM | POA: Diagnosis not present

## 2020-10-29 DIAGNOSIS — M5383 Other specified dorsopathies, cervicothoracic region: Secondary | ICD-10-CM | POA: Diagnosis not present

## 2020-10-31 DIAGNOSIS — E538 Deficiency of other specified B group vitamins: Secondary | ICD-10-CM | POA: Diagnosis not present

## 2020-12-03 DIAGNOSIS — E782 Mixed hyperlipidemia: Secondary | ICD-10-CM | POA: Diagnosis not present

## 2020-12-03 DIAGNOSIS — I10 Essential (primary) hypertension: Secondary | ICD-10-CM | POA: Diagnosis not present

## 2020-12-03 DIAGNOSIS — E538 Deficiency of other specified B group vitamins: Secondary | ICD-10-CM | POA: Diagnosis not present

## 2020-12-10 DIAGNOSIS — G4733 Obstructive sleep apnea (adult) (pediatric): Secondary | ICD-10-CM | POA: Diagnosis not present

## 2020-12-10 DIAGNOSIS — Z131 Encounter for screening for diabetes mellitus: Secondary | ICD-10-CM | POA: Diagnosis not present

## 2020-12-10 DIAGNOSIS — Z780 Asymptomatic menopausal state: Secondary | ICD-10-CM | POA: Diagnosis not present

## 2020-12-10 DIAGNOSIS — E782 Mixed hyperlipidemia: Secondary | ICD-10-CM | POA: Diagnosis not present

## 2020-12-10 DIAGNOSIS — M5136 Other intervertebral disc degeneration, lumbar region: Secondary | ICD-10-CM | POA: Diagnosis not present

## 2020-12-10 DIAGNOSIS — I10 Essential (primary) hypertension: Secondary | ICD-10-CM | POA: Diagnosis not present

## 2020-12-10 DIAGNOSIS — Z171 Estrogen receptor negative status [ER-]: Secondary | ICD-10-CM | POA: Diagnosis not present

## 2020-12-10 DIAGNOSIS — Z0000 Encounter for general adult medical examination without abnormal findings: Secondary | ICD-10-CM | POA: Diagnosis not present

## 2020-12-10 DIAGNOSIS — Z9989 Dependence on other enabling machines and devices: Secondary | ICD-10-CM | POA: Diagnosis not present

## 2020-12-10 DIAGNOSIS — C50812 Malignant neoplasm of overlapping sites of left female breast: Secondary | ICD-10-CM | POA: Diagnosis not present

## 2021-01-05 DIAGNOSIS — E538 Deficiency of other specified B group vitamins: Secondary | ICD-10-CM | POA: Diagnosis not present

## 2021-01-28 ENCOUNTER — Ambulatory Visit: Payer: PPO | Admitting: Dermatology

## 2021-01-28 ENCOUNTER — Encounter: Payer: Self-pay | Admitting: Dermatology

## 2021-01-28 ENCOUNTER — Other Ambulatory Visit: Payer: Self-pay

## 2021-01-28 DIAGNOSIS — G4733 Obstructive sleep apnea (adult) (pediatric): Secondary | ICD-10-CM | POA: Diagnosis not present

## 2021-01-28 DIAGNOSIS — L309 Dermatitis, unspecified: Secondary | ICD-10-CM | POA: Diagnosis not present

## 2021-01-28 DIAGNOSIS — L820 Inflamed seborrheic keratosis: Secondary | ICD-10-CM

## 2021-01-28 MED ORDER — MOMETASONE FUROATE 0.1 % EX CREA: 1.0000 "application " | TOPICAL_CREAM | CUTANEOUS | 1 refills | Status: DC

## 2021-01-28 MED ORDER — EUCRISA 2 % EX OINT: 1.0000 "application " | TOPICAL_OINTMENT | CUTANEOUS | 1 refills | Status: DC

## 2021-01-28 NOTE — Progress Notes (Signed)
   Follow-Up Visit   Subjective  Kelly Green is a 65 y.o. female who presents for the following: Pruritis (Bil dorsum hands, ~20yr, get itchy and use lotion and otc HC cream), Rash (Post neck, ~2wks, itchy), and Nevus (Intermammary, just noticed).  The following portions of the chart were reviewed this encounter and updated as appropriate:   Tobacco  Allergies  Meds  Problems  Med Hx  Surg Hx  Fam Hx     Review of Systems:  No other skin or systemic complaints except as noted in HPI or Assessment and Plan.  Objective  Well appearing patient in no apparent distress; mood and affect are within normal limits.  A focused examination was performed including hands, post neck, chest. Relevant physical exam findings are noted in the Assessment and Plan.  Objective  bil hands, post neck: Patchy scaliness with pinkness  Objective  R chest x 1: Erythematous keratotic or waxy stuck-on papule or plaque.    Assessment & Plan  Eczema, bil hands, post neck Start Mometasone cr qd/bid for 2 weeks then d/c In 2 wks start Eucrisa oint qd/bid until clear then prn flares  May consider Opzelura in the future..  CeraVe moisturizer regularly.  Atopic dermatitis (eczema) is a chronic, relapsing, pruritic condition that can significantly affect quality of life. It is often associated with allergic rhinitis and/or asthma and can require treatment with topical medications, phototherapy, or in severe cases a biologic medication called Dupixent in older children and adults.   mometasone (ELOCON) 0.1 % cream - bil hands, post neck Crisaborole (EUCRISA) 2 % OINT - bil hands, post neck  Inflamed seborrheic keratosis R chest x 1 Destruction of lesion - R chest x 1 Complexity: simple   Destruction method: cryotherapy   Informed consent: discussed and consent obtained   Timeout:  patient name, date of birth, surgical site, and procedure verified Lesion destroyed using liquid nitrogen: Yes   Region  frozen until ice ball extended beyond lesion: Yes   Outcome: patient tolerated procedure well with no complications   Post-procedure details: wound care instructions given    Return in about 6 months (around 07/31/2021) for eczema.  I, Othelia Pulling, RMA, am acting as scribe for Sarina Ser, MD .  Documentation: I have reviewed the above documentation for accuracy and completeness, and I agree with the above.  Sarina Ser, MD

## 2021-01-28 NOTE — Patient Instructions (Addendum)
If you have any questions or concerns for your doctor, please call our main line at [phone] and press option 4 to reach your doctor's medical assistant. If no one answers, please leave a voicemail as directed and we will return your call as soon as possible. Messages left after 4 pm will be answered the following business day.   You may also send [place] a message via [place]. We typically respond to MyChart messages within 1-2 business days.  For prescription refills, please ask your pharmacy to contact our office. Our fax number is [phone].  If you have an urgent issue when the clinic is closed that cannot wait until the next business day, you can page your doctor at the number below.    Please note that while we do our best to be available for urgent issues outside of office hours, we are not available 24/7.   If you have an urgent issue and are unable to reach [place], you may choose to seek medical care at your doctor's office, retail clinic, urgent care center, or emergency room.  If you have a medical emergency, please immediately call 911 or go to the emergency department.  Pager Numbers  - Dr. Nehemiah Massed: [phone]  - Dr. Laurence Ferrari: [phone]  - Dr. Nicole Kindred: [phone]  In the event of inclement weather, please call our main line at [phone] for an update on the status of any delays or closures.  Dermatology Medication Tips: Please keep the boxes that topical medications come in in order to help keep track of the instructions about where and how to use these. Pharmacies typically print the medication instructions only on the boxes and not directly on the medication tubes.   If your medication is too expensive, please contact our office at [phone] option 4 or send [place] a message through [place].   We are unable to tell what your co-pay for medications will be in advance as this is different depending on your insurance coverage. However, we may be able to find a substitute  medication at lower cost or fill out paperwork to get insurance to cover a needed medication.   If a prior authorization is required to get your medication covered by your insurance company, please allow [place] 1-2 business days to complete this process.  Drug prices often vary depending on where the prescription is filled and some pharmacies may offer cheaper prices.  The website www.goodrx.com contains coupons for medications through different pharmacies. The prices here do not account for what the cost may be with help from insurance (it may be cheaper with your insurance), but the website can give you the price if you did not use any insurance.  - You can print the associated coupon and take it with your prescription to the pharmacy.  - You may also stop by our office during regular business hours and pick up a GoodRx coupon card.  - If you need your prescription sent electronically to a different pharmacy, notify our office through [facility] or by phone at [phone] option 4.    Start Mometasone cream 1 to 2 times a day to itchy rash on hands and neck for 2 weeks then stop  In 2 weeks start Eucrisa ointment 1 to 2 times a day to itchy rash on hands and neck until clear then as needed for flares.

## 2021-02-06 ENCOUNTER — Encounter: Payer: Self-pay | Admitting: Dermatology

## 2021-02-17 DIAGNOSIS — M9904 Segmental and somatic dysfunction of sacral region: Secondary | ICD-10-CM | POA: Diagnosis not present

## 2021-02-17 DIAGNOSIS — M9903 Segmental and somatic dysfunction of lumbar region: Secondary | ICD-10-CM | POA: Diagnosis not present

## 2021-02-17 DIAGNOSIS — M5383 Other specified dorsopathies, cervicothoracic region: Secondary | ICD-10-CM | POA: Diagnosis not present

## 2021-02-17 DIAGNOSIS — M9901 Segmental and somatic dysfunction of cervical region: Secondary | ICD-10-CM | POA: Diagnosis not present

## 2021-02-17 DIAGNOSIS — M9902 Segmental and somatic dysfunction of thoracic region: Secondary | ICD-10-CM | POA: Diagnosis not present

## 2021-02-17 DIAGNOSIS — M5387 Other specified dorsopathies, lumbosacral region: Secondary | ICD-10-CM | POA: Diagnosis not present

## 2021-02-17 DIAGNOSIS — M7502 Adhesive capsulitis of left shoulder: Secondary | ICD-10-CM | POA: Diagnosis not present

## 2021-02-17 DIAGNOSIS — M9907 Segmental and somatic dysfunction of upper extremity: Secondary | ICD-10-CM | POA: Diagnosis not present

## 2021-02-18 DIAGNOSIS — M9901 Segmental and somatic dysfunction of cervical region: Secondary | ICD-10-CM | POA: Diagnosis not present

## 2021-02-18 DIAGNOSIS — M5383 Other specified dorsopathies, cervicothoracic region: Secondary | ICD-10-CM | POA: Diagnosis not present

## 2021-02-18 DIAGNOSIS — M5387 Other specified dorsopathies, lumbosacral region: Secondary | ICD-10-CM | POA: Diagnosis not present

## 2021-02-18 DIAGNOSIS — M9902 Segmental and somatic dysfunction of thoracic region: Secondary | ICD-10-CM | POA: Diagnosis not present

## 2021-02-18 DIAGNOSIS — M9904 Segmental and somatic dysfunction of sacral region: Secondary | ICD-10-CM | POA: Diagnosis not present

## 2021-02-18 DIAGNOSIS — M7502 Adhesive capsulitis of left shoulder: Secondary | ICD-10-CM | POA: Diagnosis not present

## 2021-02-18 DIAGNOSIS — M9907 Segmental and somatic dysfunction of upper extremity: Secondary | ICD-10-CM | POA: Diagnosis not present

## 2021-02-18 DIAGNOSIS — M9903 Segmental and somatic dysfunction of lumbar region: Secondary | ICD-10-CM | POA: Diagnosis not present

## 2021-02-25 DIAGNOSIS — M9904 Segmental and somatic dysfunction of sacral region: Secondary | ICD-10-CM | POA: Diagnosis not present

## 2021-02-25 DIAGNOSIS — M7502 Adhesive capsulitis of left shoulder: Secondary | ICD-10-CM | POA: Diagnosis not present

## 2021-02-25 DIAGNOSIS — M9902 Segmental and somatic dysfunction of thoracic region: Secondary | ICD-10-CM | POA: Diagnosis not present

## 2021-02-25 DIAGNOSIS — M9907 Segmental and somatic dysfunction of upper extremity: Secondary | ICD-10-CM | POA: Diagnosis not present

## 2021-02-25 DIAGNOSIS — M9903 Segmental and somatic dysfunction of lumbar region: Secondary | ICD-10-CM | POA: Diagnosis not present

## 2021-02-25 DIAGNOSIS — M9901 Segmental and somatic dysfunction of cervical region: Secondary | ICD-10-CM | POA: Diagnosis not present

## 2021-02-25 DIAGNOSIS — M5387 Other specified dorsopathies, lumbosacral region: Secondary | ICD-10-CM | POA: Diagnosis not present

## 2021-02-25 DIAGNOSIS — M5383 Other specified dorsopathies, cervicothoracic region: Secondary | ICD-10-CM | POA: Diagnosis not present

## 2021-02-27 DIAGNOSIS — G4733 Obstructive sleep apnea (adult) (pediatric): Secondary | ICD-10-CM | POA: Diagnosis not present

## 2021-03-03 DIAGNOSIS — M9907 Segmental and somatic dysfunction of upper extremity: Secondary | ICD-10-CM | POA: Diagnosis not present

## 2021-03-03 DIAGNOSIS — M9903 Segmental and somatic dysfunction of lumbar region: Secondary | ICD-10-CM | POA: Diagnosis not present

## 2021-03-03 DIAGNOSIS — E538 Deficiency of other specified B group vitamins: Secondary | ICD-10-CM | POA: Diagnosis not present

## 2021-03-03 DIAGNOSIS — M5383 Other specified dorsopathies, cervicothoracic region: Secondary | ICD-10-CM | POA: Diagnosis not present

## 2021-03-03 DIAGNOSIS — M9901 Segmental and somatic dysfunction of cervical region: Secondary | ICD-10-CM | POA: Diagnosis not present

## 2021-03-03 DIAGNOSIS — M9902 Segmental and somatic dysfunction of thoracic region: Secondary | ICD-10-CM | POA: Diagnosis not present

## 2021-03-03 DIAGNOSIS — M5387 Other specified dorsopathies, lumbosacral region: Secondary | ICD-10-CM | POA: Diagnosis not present

## 2021-03-03 DIAGNOSIS — M7502 Adhesive capsulitis of left shoulder: Secondary | ICD-10-CM | POA: Diagnosis not present

## 2021-03-03 DIAGNOSIS — M9904 Segmental and somatic dysfunction of sacral region: Secondary | ICD-10-CM | POA: Diagnosis not present

## 2021-03-11 DIAGNOSIS — M9902 Segmental and somatic dysfunction of thoracic region: Secondary | ICD-10-CM | POA: Diagnosis not present

## 2021-03-11 DIAGNOSIS — M5383 Other specified dorsopathies, cervicothoracic region: Secondary | ICD-10-CM | POA: Diagnosis not present

## 2021-03-11 DIAGNOSIS — M5387 Other specified dorsopathies, lumbosacral region: Secondary | ICD-10-CM | POA: Diagnosis not present

## 2021-03-11 DIAGNOSIS — M9904 Segmental and somatic dysfunction of sacral region: Secondary | ICD-10-CM | POA: Diagnosis not present

## 2021-03-11 DIAGNOSIS — M9907 Segmental and somatic dysfunction of upper extremity: Secondary | ICD-10-CM | POA: Diagnosis not present

## 2021-03-11 DIAGNOSIS — M9903 Segmental and somatic dysfunction of lumbar region: Secondary | ICD-10-CM | POA: Diagnosis not present

## 2021-03-11 DIAGNOSIS — M7502 Adhesive capsulitis of left shoulder: Secondary | ICD-10-CM | POA: Diagnosis not present

## 2021-03-11 DIAGNOSIS — M9901 Segmental and somatic dysfunction of cervical region: Secondary | ICD-10-CM | POA: Diagnosis not present

## 2021-03-18 DIAGNOSIS — M9902 Segmental and somatic dysfunction of thoracic region: Secondary | ICD-10-CM | POA: Diagnosis not present

## 2021-03-18 DIAGNOSIS — M5383 Other specified dorsopathies, cervicothoracic region: Secondary | ICD-10-CM | POA: Diagnosis not present

## 2021-03-18 DIAGNOSIS — M9907 Segmental and somatic dysfunction of upper extremity: Secondary | ICD-10-CM | POA: Diagnosis not present

## 2021-03-18 DIAGNOSIS — M9901 Segmental and somatic dysfunction of cervical region: Secondary | ICD-10-CM | POA: Diagnosis not present

## 2021-03-18 DIAGNOSIS — M7502 Adhesive capsulitis of left shoulder: Secondary | ICD-10-CM | POA: Diagnosis not present

## 2021-03-30 DIAGNOSIS — G4733 Obstructive sleep apnea (adult) (pediatric): Secondary | ICD-10-CM | POA: Diagnosis not present

## 2021-04-08 DIAGNOSIS — E538 Deficiency of other specified B group vitamins: Secondary | ICD-10-CM | POA: Diagnosis not present

## 2021-04-15 DIAGNOSIS — M9907 Segmental and somatic dysfunction of upper extremity: Secondary | ICD-10-CM | POA: Diagnosis not present

## 2021-04-15 DIAGNOSIS — M9902 Segmental and somatic dysfunction of thoracic region: Secondary | ICD-10-CM | POA: Diagnosis not present

## 2021-04-15 DIAGNOSIS — M9901 Segmental and somatic dysfunction of cervical region: Secondary | ICD-10-CM | POA: Diagnosis not present

## 2021-04-15 DIAGNOSIS — M5383 Other specified dorsopathies, cervicothoracic region: Secondary | ICD-10-CM | POA: Diagnosis not present

## 2021-04-15 DIAGNOSIS — M7502 Adhesive capsulitis of left shoulder: Secondary | ICD-10-CM | POA: Diagnosis not present

## 2021-04-21 DIAGNOSIS — Z20828 Contact with and (suspected) exposure to other viral communicable diseases: Secondary | ICD-10-CM | POA: Diagnosis not present

## 2021-05-08 DIAGNOSIS — G4733 Obstructive sleep apnea (adult) (pediatric): Secondary | ICD-10-CM | POA: Diagnosis not present

## 2021-05-20 DIAGNOSIS — E538 Deficiency of other specified B group vitamins: Secondary | ICD-10-CM | POA: Diagnosis not present

## 2021-06-08 DIAGNOSIS — G4733 Obstructive sleep apnea (adult) (pediatric): Secondary | ICD-10-CM | POA: Diagnosis not present

## 2021-06-09 DIAGNOSIS — I10 Essential (primary) hypertension: Secondary | ICD-10-CM | POA: Diagnosis not present

## 2021-06-09 DIAGNOSIS — E782 Mixed hyperlipidemia: Secondary | ICD-10-CM | POA: Diagnosis not present

## 2021-06-09 DIAGNOSIS — Z131 Encounter for screening for diabetes mellitus: Secondary | ICD-10-CM | POA: Diagnosis not present

## 2021-06-19 DIAGNOSIS — D508 Other iron deficiency anemias: Secondary | ICD-10-CM | POA: Diagnosis not present

## 2021-06-19 DIAGNOSIS — Z1231 Encounter for screening mammogram for malignant neoplasm of breast: Secondary | ICD-10-CM | POA: Diagnosis not present

## 2021-06-19 DIAGNOSIS — E782 Mixed hyperlipidemia: Secondary | ICD-10-CM | POA: Diagnosis not present

## 2021-06-19 DIAGNOSIS — M19012 Primary osteoarthritis, left shoulder: Secondary | ICD-10-CM | POA: Diagnosis not present

## 2021-06-19 DIAGNOSIS — M8580 Other specified disorders of bone density and structure, unspecified site: Secondary | ICD-10-CM | POA: Diagnosis not present

## 2021-06-19 DIAGNOSIS — M25712 Osteophyte, left shoulder: Secondary | ICD-10-CM | POA: Diagnosis not present

## 2021-06-19 DIAGNOSIS — E538 Deficiency of other specified B group vitamins: Secondary | ICD-10-CM | POA: Diagnosis not present

## 2021-06-19 DIAGNOSIS — Z23 Encounter for immunization: Secondary | ICD-10-CM | POA: Diagnosis not present

## 2021-06-19 DIAGNOSIS — Z9989 Dependence on other enabling machines and devices: Secondary | ICD-10-CM | POA: Diagnosis not present

## 2021-06-19 DIAGNOSIS — I10 Essential (primary) hypertension: Secondary | ICD-10-CM | POA: Diagnosis not present

## 2021-06-19 DIAGNOSIS — Z20822 Contact with and (suspected) exposure to covid-19: Secondary | ICD-10-CM | POA: Diagnosis not present

## 2021-06-19 DIAGNOSIS — M25512 Pain in left shoulder: Secondary | ICD-10-CM | POA: Diagnosis not present

## 2021-06-19 DIAGNOSIS — G4733 Obstructive sleep apnea (adult) (pediatric): Secondary | ICD-10-CM | POA: Diagnosis not present

## 2021-06-19 DIAGNOSIS — Z0000 Encounter for general adult medical examination without abnormal findings: Secondary | ICD-10-CM | POA: Diagnosis not present

## 2021-07-13 ENCOUNTER — Encounter: Payer: Self-pay | Admitting: Dermatology

## 2021-07-13 ENCOUNTER — Other Ambulatory Visit: Payer: Self-pay

## 2021-07-13 ENCOUNTER — Ambulatory Visit: Payer: PPO | Admitting: Dermatology

## 2021-07-13 DIAGNOSIS — L209 Atopic dermatitis, unspecified: Secondary | ICD-10-CM

## 2021-07-13 DIAGNOSIS — L821 Other seborrheic keratosis: Secondary | ICD-10-CM

## 2021-07-13 DIAGNOSIS — L910 Hypertrophic scar: Secondary | ICD-10-CM

## 2021-07-13 DIAGNOSIS — L309 Dermatitis, unspecified: Secondary | ICD-10-CM

## 2021-07-13 MED ORDER — EUCRISA 2 % EX OINT
1.0000 | TOPICAL_OINTMENT | CUTANEOUS | 11 refills | Status: DC
Start: 2021-07-13 — End: 2022-07-23

## 2021-07-13 MED ORDER — MOMETASONE FUROATE 0.1 % EX CREA
1.0000 | TOPICAL_CREAM | CUTANEOUS | 11 refills | Status: DC
Start: 2021-07-13 — End: 2022-07-23

## 2021-07-13 NOTE — Progress Notes (Signed)
   Follow-Up Visit   Subjective  Kelly Green is a 65 y.o. female who presents for the following: Follow-up (Patient here today for eczema follow up at bilateral hands and neck. Patient states that eczema has gotten some better and is continuing to use eucrisa as needed. ).  The following portions of the chart were reviewed this encounter and updated as appropriate:  Tobacco  Allergies  Meds  Problems  Med Hx  Surg Hx  Fam Hx     Review of Systems: No other skin or systemic complaints except as noted in HPI or Assessment and Plan.  Objective  Well appearing patient in no apparent distress; mood and affect are within normal limits.  A focused examination was performed including left chest, hands, and neck. Relevant physical exam findings are noted in the Assessment and Plan.  bilateral hands Roughness of right thumb web space   Left Breast Hypertrophic scar   Assessment & Plan  Eczema, hand dermatitis -mild flare today likely from cold weather. Overall improved with Eucrisa, but not to goal bilateral hands Atopic dermatitis (eczema) is a chronic, relapsing, pruritic condition that can significantly affect quality of life. It is often associated with allergic rhinitis and/or asthma and can require treatment with topical medications, phototherapy, or in severe cases biologic injectable medication (Dupixent; Adbry) or Oral JAK inhibitors.  Continue mometasone 0.1 % cream for flares- apply as needed   Continue Eucrisa 2 % ointment qd to bid regularly aa itchy rash on hands and posterior neck until clear, then prn flares   Will consider opzelura in future.   Related Medications mometasone (ELOCON) 0.1 % cream Apply 1 application topically as directed. Qd to bid aa itchy rash on neck and hands for 2 weeks then d/c  Crisaborole (EUCRISA) 2 % OINT Apply 1 application topically as directed. Qd to bid aa itchy rash on hands and posterior neck until clear, then prn  flares  Hypertrophic scar Left Breast Patient reports some itching at area Recommend to use mometasone at scar as needed May consider Mederma or Serica scar treatments Can also use cocoa butter at area as needed.   Seborrheic Keratoses - Stuck-on, waxy, tan-[place] papules and/or plaques  - Benign-appearing - Discussed benign etiology and prognosis. - Observe - Call for any changes  Return in about 1 year (around 07/13/2022) for tbse / follow up. IRuthell Rummage, CMA, am acting as scribe for Sarina Ser, MD. Documentation: I have reviewed the above documentation for accuracy and completeness, and I agree with the above.  Sarina Ser, MD

## 2021-07-13 NOTE — Patient Instructions (Addendum)
Recommend Serica moisturizing scar formula cream every night or [facility] brand or Mederma silicone scar sheet every night for the first year after a scar appears to help with scar remodeling if desired. Scars remodel on their own for a full year.     If you have any questions or concerns for your doctor, please call our main line at [phone] and press option 4 to reach your doctor's medical assistant. If no one answers, please leave a voicemail as directed and we will return your call as soon as possible. Messages left after 4 pm will be answered the following business day.   You may also send [place] a message via [place]. We typically respond to MyChart messages within 1-2 business days.  For prescription refills, please ask your pharmacy to contact our office. Our fax number is [phone].  If you have an urgent issue when the clinic is closed that cannot wait until the next business day, you can page your doctor at the number below.    Please note that while we do our best to be available for urgent issues outside of office hours, we are not available 24/7.   If you have an urgent issue and are unable to reach [place], you may choose to seek medical care at your doctor's office, retail clinic, urgent care center, or emergency room.  If you have a medical emergency, please immediately call 911 or go to the emergency department.  Pager Numbers  - Dr. Nehemiah Massed: [phone]  - Dr. Laurence Ferrari: [phone]  - Dr. Nicole Kindred: [phone]  In the event of inclement weather, please call our main line at [phone] for an update on the status of any delays or closures.  Dermatology Medication Tips: Please keep the boxes that topical medications come in in order to help keep track of the instructions about where and how to use these. Pharmacies typically print the medication instructions only on the boxes and not directly on the medication tubes.   If your medication is too expensive, please contact  our office at [phone] option 4 or send [place] a message through [place].   We are unable to tell what your co-pay for medications will be in advance as this is different depending on your insurance coverage. However, we may be able to find a substitute medication at lower cost or fill out paperwork to get insurance to cover a needed medication.   If a prior authorization is required to get your medication covered by your insurance company, please allow [place] 1-2 business days to complete this process.  Drug prices often vary depending on where the prescription is filled and some pharmacies may offer cheaper prices.  The website www.goodrx.com contains coupons for medications through different pharmacies. The prices here do not account for what the cost may be with help from insurance (it may be cheaper with your insurance), but the website can give you the price if you did not use any insurance.  - You can print the associated coupon and take it with your prescription to the pharmacy.  - You may also stop by our office during regular business hours and pick up a GoodRx coupon card.  - If you need your prescription sent electronically to a different pharmacy, notify our office through [facility] or by phone at [phone] option 4.

## 2021-07-15 DIAGNOSIS — M25512 Pain in left shoulder: Secondary | ICD-10-CM | POA: Diagnosis not present

## 2021-07-15 DIAGNOSIS — M19011 Primary osteoarthritis, right shoulder: Secondary | ICD-10-CM | POA: Diagnosis not present

## 2021-07-22 DIAGNOSIS — E538 Deficiency of other specified B group vitamins: Secondary | ICD-10-CM | POA: Diagnosis not present

## 2021-08-03 ENCOUNTER — Other Ambulatory Visit: Payer: Self-pay

## 2021-08-03 ENCOUNTER — Ambulatory Visit
Admission: RE | Admit: 2021-08-03 | Discharge: 2021-08-03 | Disposition: A | Discharge status: Home / Self Care | Payer: PPO | Source: Ambulatory Visit | Attending: Internal Medicine | Admitting: Internal Medicine

## 2021-08-03 DIAGNOSIS — Z171 Estrogen receptor negative status [ER-]: Secondary | ICD-10-CM | POA: Diagnosis not present

## 2021-08-03 DIAGNOSIS — Z1231 Encounter for screening mammogram for malignant neoplasm of breast: Secondary | ICD-10-CM | POA: Diagnosis not present

## 2021-08-03 DIAGNOSIS — C50812 Malignant neoplasm of overlapping sites of left female breast: Secondary | ICD-10-CM | POA: Insufficient documentation

## 2021-08-24 DIAGNOSIS — E538 Deficiency of other specified B group vitamins: Secondary | ICD-10-CM | POA: Diagnosis not present

## 2021-09-04 ENCOUNTER — Ambulatory Visit: Payer: PPO | Admitting: Internal Medicine

## 2021-09-04 ENCOUNTER — Other Ambulatory Visit: Payer: PPO

## 2021-09-11 ENCOUNTER — Encounter: Payer: Self-pay | Admitting: Internal Medicine

## 2021-09-11 ENCOUNTER — Inpatient Hospital Stay (HOSPITAL_BASED_OUTPATIENT_CLINIC_OR_DEPARTMENT_OTHER): Payer: PPO | Admitting: Internal Medicine

## 2021-09-11 ENCOUNTER — Other Ambulatory Visit: Payer: Self-pay

## 2021-09-11 ENCOUNTER — Inpatient Hospital Stay: Payer: PPO | Attending: Internal Medicine

## 2021-09-11 DIAGNOSIS — C50812 Malignant neoplasm of overlapping sites of left female breast: Secondary | ICD-10-CM

## 2021-09-11 DIAGNOSIS — M8580 Other specified disorders of bone density and structure, unspecified site: Secondary | ICD-10-CM | POA: Diagnosis not present

## 2021-09-11 DIAGNOSIS — Z853 Personal history of malignant neoplasm of breast: Secondary | ICD-10-CM | POA: Insufficient documentation

## 2021-09-11 DIAGNOSIS — M2550 Pain in unspecified joint: Secondary | ICD-10-CM | POA: Diagnosis not present

## 2021-09-11 DIAGNOSIS — Z171 Estrogen receptor negative status [ER-]: Secondary | ICD-10-CM

## 2021-09-11 LAB — CBC WITH DIFFERENTIAL/PLATELET
Abs Immature Granulocytes: 0.03 10*3/uL (ref 0.00–0.07)
Basophils Absolute: 0 10*3/uL (ref 0.0–0.1)
Basophils Relative: 0 %
Eosinophils Absolute: 0.2 10*3/uL (ref 0.0–0.5)
Eosinophils Relative: 2 %
HCT: 38.6 % (ref 36.0–46.0)
Hemoglobin: 13.2 g/dL (ref 12.0–15.0)
Immature Granulocytes: 0 %
Lymphocytes Relative: 37 %
Lymphs Abs: 2.6 10*3/uL (ref 0.7–4.0)
MCH: 27.8 pg (ref 26.0–34.0)
MCHC: 34.2 g/dL (ref 30.0–36.0)
MCV: 81.4 fL (ref 80.0–100.0)
Monocytes Absolute: 0.5 10*3/uL (ref 0.1–1.0)
Monocytes Relative: 8 %
Neutro Abs: 3.7 10*3/uL (ref 1.7–7.7)
Neutrophils Relative %: 53 %
Platelets: 244 10*3/uL (ref 150–400)
RBC: 4.74 MIL/uL (ref 3.87–5.11)
RDW: 12.5 % (ref 11.5–15.5)
WBC: 7 10*3/uL (ref 4.0–10.5)
nRBC: 0 % (ref 0.0–0.2)

## 2021-09-11 LAB — COMPREHENSIVE METABOLIC PANEL
ALT: 24 U/L (ref 0–44)
AST: 25 U/L (ref 15–41)
Albumin: 4.3 g/dL (ref 3.5–5.0)
Alkaline Phosphatase: 119 U/L (ref 38–126)
Anion gap: 10 (ref 5–15)
BUN: 18 mg/dL (ref 8–23)
CO2: 26 mmol/L (ref 22–32)
Calcium: 9.6 mg/dL (ref 8.9–10.3)
Chloride: 98 mmol/L (ref 98–111)
Creatinine, Ser: 0.68 mg/dL (ref 0.44–1.00)
GFR, Estimated: >60 mL/min (ref >60)
Glucose, Bld: 98 mg/dL (ref 70–99)
Potassium: 3.3 mmol/L — ABNORMAL LOW (ref 3.5–5.1)
Sodium: 134 mmol/L — ABNORMAL LOW (ref 135–145)
Total Bilirubin: 1 mg/dL (ref 0.3–1.2)
Total Protein: 7.4 g/dL (ref 6.5–8.1)

## 2021-09-11 NOTE — Progress Notes (Signed)
[place] OFFICE PROGRESS NOTE  Patient Care Team: Marinda Elk, MD as PCP - General (Physician Assistant)   Cancer Staging  No matching staging information was found for the patient.   Oncology History Overview Note  # 2011- stage pII pT2 pN0 ( SN) M0  invasive ductal carcinoma of left breast , ER 0%, PR  1%, her2/neu not overexpressed who is s/p chemotherapy per protocol NSABP 47 Arm 1-A TC X 6 which she was randomized 08/27/10.  She has completed radiation to left breast per Dr. Baruch Gouty in June 2012  2.BRCA1 and BRCA2 mutations are not identified (December, 2014)  #Obstructive sleep apnea/CPAP 2019.   DIAGNOSIS: breast cancer  STAGE:   II     ;GOALS: cure  CURRENT/MOST RECENT THERAPY: surveillaince       Carcinoma of overlapping sites of left breast in female, estrogen receptor negative ([place])  12/05/2019 Genetic Testing   Negative genetic testing on the common hereditary cancer panel. The Common Hereditary Gene Panel offered by [facility] includes sequencing and/or deletion duplication testing of the following 48 genes: APC, ATM, AXIN2, BARD1, BMPR1A, BRCA1, BRCA2, BRIP1, CDH1, CDK4, CDKN2A (p14ARF), CDKN2A (p16INK4a), CHEK2, CTNNA1, DICER1, EPCAM (Deletion/duplication testing only), GREM1 (promoter region deletion/duplication testing only), KIT, MEN1, MLH1, MSH2, MSH3, MSH6, MUTYH, NBN, NF1, NHTL1, PALB2, PDGFRA, PMS2, POLD1, POLE, PTEN, RAD50, RAD51C, RAD51D, RNF43, SDHB, SDHC, SDHD, SMAD4, SMARCA4. STK11, TP53, TSC1, TSC2, and VHL.  The following genes were evaluated for sequence changes only: SDHA and HOXB13 c.251G>A variant only. The report date is December 05, 2019.       INTERVAL HISTORY: Alone.  Ambulating independently.  Kelly Green 65 y.o.  female pleasant patient above history of triple negative breast cancer stage II is here for follow-up.  Continues to chronic joint pains not any worse.  No new shortness of breath or cough.  Review of Systems   Constitutional:  Negative for chills, diaphoresis, fever, malaise/fatigue and weight loss.  HENT:  Negative for nosebleeds and sore throat.   Eyes:  Negative for double vision.  Respiratory:  Negative for cough, hemoptysis, sputum production, shortness of breath and wheezing.   Cardiovascular:  Negative for chest pain, palpitations, orthopnea and leg swelling.  Gastrointestinal:  Negative for abdominal pain, blood in stool, constipation, diarrhea, heartburn, melena, nausea and vomiting.  Genitourinary:  Negative for dysuria, frequency and urgency.  Musculoskeletal:  Positive for back pain and joint pain.  Skin: Negative.  Negative for itching and rash.  Neurological:  Negative for dizziness, tingling, focal weakness, weakness and headaches.  Endo/Heme/Allergies:  Does not bruise/bleed easily.  Psychiatric/Behavioral:  Negative for depression. The patient is not nervous/anxious and does not have insomnia.     PAST MEDICAL HISTORY :  Past Medical History:  Diagnosis Date   Actinic keratosis    Anemia    iron def.anemia   Breast cancer ([place]) 2011   left breast with lumpectomy, chemo and rad tx   Family history of brain cancer    Family history of kidney cancer    Hyperlipemia    Hypertension    Hyperthyroidism    Personal history of chemotherapy    Personal history of radiation therapy    Scoliosis, adolescent acquired    Vitamin B 12 deficiency     PAST SURGICAL HISTORY :   Past Surgical History:  Procedure Laterality Date   APPENDECTOMY     BREAST BIOPSY Left 2011   neg with concordant   BREAST CYST ASPIRATION Bilateral  neg   BREAST LUMPECTOMY Left 2011   with radiation and chemo   COLONOSCOPY WITH PROPOFOL N/A 01/14/2017   Procedure: COLONOSCOPY WITH PROPOFOL;  Surgeon: Lollie Sails, MD;  Location: [facility] ENDOSCOPY;  Service: Endoscopy;  Laterality: N/A;   Thyroid Goiter Removal  35 years agi    FAMILY HISTORY :   Family History  Problem Relation Age of Onset    Dementia Mother    Heart attack Father 74   Heart attack Maternal Aunt    Heart attack Maternal Grandmother    Dementia Maternal Grandfather    Brain cancer Cousin 58       pat first cousin   Cirrhosis Cousin        2 pat first cousins   Liver cancer Cousin        pat first cousin   Kidney cancer Cousin        pat first cousin   Breast cancer Cousin 73       mat first cousin's daughter    SOCIAL HISTORY:   Social History   Tobacco Use   Smoking status: Never   Smokeless tobacco: Never  Vaping Use   Vaping Use: Never used  Substance Use Topics   Alcohol use: No   Drug use: No    ALLERGIES:  is allergic to septra [sulfamethoxazole-trimethoprim].  MEDICATIONS:  Current Outpatient Medications  Medication Sig Dispense Refill   amLODipine (NORVASC) 5 MG tablet Take 5 mg by mouth daily.      Cholecalciferol 25 MCG (1000 UT) tablet Take by mouth.     Crisaborole (EUCRISA) 2 % OINT Apply 1 application topically as directed. Qd to bid aa itchy rash on hands and posterior neck until clear, then prn flares 100 g 11   ferrous gluconate (FERGON) 240 (27 FE) MG tablet Take 240 mg by mouth daily.      hydrochlorothiazide (HYDRODIURIL) 25 MG tablet Take 1 tablet by mouth daily.     losartan (COZAAR) 100 MG tablet Take 1 tablet by mouth daily.     mometasone (ELOCON) 0.1 % cream Apply 1 application topically as directed. Qd to bid aa itchy rash on neck and hands for 2 weeks then d/c 45 g 11   Multiple Minerals-Vitamins (CALCIUM & VIT D3 BONE HEALTH PO) Take by mouth.     Multiple Vitamin (MULTI-VITAMINS) TABS Take by mouth.     Omega-3 Fatty Acids (FISH OIL) 1000 MG CAPS Take by mouth.     omeprazole (PRILOSEC) 20 MG capsule Take by mouth.     No current facility-administered medications for this visit.    PHYSICAL EXAMINATION: ECOG PERFORMANCE STATUS: 0 - Asymptomatic  BP 135/86 (BP Location: Right Arm, Patient Position: Sitting, Cuff Size: Normal)    Pulse 89    Temp (!) 97.5  F (36.4 C) (Tympanic)    Wt 159 lb 12.8 oz (72.5 kg)    SpO2 100%    BMI 30.19 kg/m   Filed Weights   09/11/21 0903  Weight: 159 lb 12.8 oz (72.5 kg)    Physical Exam HENT:     Head: Normocephalic and atraumatic.     Mouth/Throat:     Pharynx: No oropharyngeal exudate.  Eyes:     Pupils: Pupils are equal, round, and reactive to light.  Cardiovascular:     Rate and Rhythm: Normal rate and regular rhythm.  Pulmonary:     Effort: Pulmonary effort is normal. No respiratory distress.     Breath sounds: Normal  breath sounds. No wheezing.  Abdominal:     General: Bowel sounds are normal. There is no distension.     Palpations: Abdomen is soft. There is no mass.     Tenderness: There is no abdominal tenderness. There is no guarding or rebound.  Musculoskeletal:        General: No tenderness. Normal range of motion.     Cervical back: Normal range of motion and neck supple.  Skin:    General: Skin is warm.     Comments:    Neurological:     Mental Status: She is alert and oriented to person, place, and time.  Psychiatric:        Mood and Affect: Affect normal.     LABORATORY DATA:  I have reviewed the data as listed    Component Value Date/Time   NA 134 (L) 09/11/2021 0843   NA 140 08/28/2014 1511   K 3.3 (L) 09/11/2021 0843   K 3.6 08/28/2014 1511   CL 98 09/11/2021 0843   CL 104 08/28/2014 1511   CO2 26 09/11/2021 0843   CO2 32 08/28/2014 1511   GLUCOSE 98 09/11/2021 0843   GLUCOSE 99 08/28/2014 1511   BUN 18 09/11/2021 0843   BUN 16 08/28/2014 1511   CREATININE 0.68 09/11/2021 0843   CREATININE 0.66 08/28/2014 1511   CALCIUM 9.6 09/11/2021 0843   CALCIUM 9.3 08/28/2014 1511   PROT 7.4 09/11/2021 0843   PROT 7.2 08/28/2014 1511   ALBUMIN 4.3 09/11/2021 0843   ALBUMIN 4.0 08/28/2014 1511   AST 25 09/11/2021 0843   AST 20 08/28/2014 1511   ALT 24 09/11/2021 0843   ALT 29 08/28/2014 1511   ALKPHOS 119 09/11/2021 0843   ALKPHOS 116 08/28/2014 1511   BILITOT  1.0 09/11/2021 0843   BILITOT 0.4 08/28/2014 1511   GFRNONAA >60 09/11/2021 0843   GFRNONAA >60 08/28/2014 1511   GFRNONAA >60 02/20/2014 1518   GFRAA >60 09/05/2019 1015   GFRAA >60 08/28/2014 1511   GFRAA >60 02/20/2014 1518    No results found for: SPEP, UPEP  Lab Results  Component Value Date   WBC 7.0 09/11/2021   NEUTROABS 3.7 09/11/2021   HGB 13.2 09/11/2021   HCT 38.6 09/11/2021   MCV 81.4 09/11/2021   PLT 244 09/11/2021      Chemistry      Component Value Date/Time   NA 134 (L) 09/11/2021 0843   NA 140 08/28/2014 1511   K 3.3 (L) 09/11/2021 0843   K 3.6 08/28/2014 1511   CL 98 09/11/2021 0843   CL 104 08/28/2014 1511   CO2 26 09/11/2021 0843   CO2 32 08/28/2014 1511   BUN 18 09/11/2021 0843   BUN 16 08/28/2014 1511   CREATININE 0.68 09/11/2021 0843   CREATININE 0.66 08/28/2014 1511      Component Value Date/Time   CALCIUM 9.6 09/11/2021 0843   CALCIUM 9.3 08/28/2014 1511   ALKPHOS 119 09/11/2021 0843   ALKPHOS 116 08/28/2014 1511   AST 25 09/11/2021 0843   AST 20 08/28/2014 1511   ALT 24 09/11/2021 0843   ALT 29 08/28/2014 1511   BILITOT 1.0 09/11/2021 0843   BILITOT 0.4 08/28/2014 1511       RADIOGRAPHIC STUDIES: I have personally reviewed the radiological images as listed and agreed with the findings in the report. No results found.   ASSESSMENT & PLAN:  Carcinoma of overlapping sites of left breast in female, estrogen receptor negative ([place]) #  Stage II ER negative HER-2/neu negative left breast cancer status post chemotherapy [2012]; radiation. Nov 2022- Mammogram; STABLE.  Again reviewed that patient is essentially cured of her breast cancer.  However need to continue surveillance of new breast cancers.  # OCT 2021 BMD [PCP]- OSTEOPENIA-  Pt wants holistic approach; will wait to repeat BMD with PCP in 2023/PCP.    # DISPOSITION:  # follow up in 12 months/ labs-cbc/cmp/ca-27-29/ Mammogram prior- Dr.B  Cc; Ms.McLaughlin.    Orders Placed  This Encounter  Procedures   MM 3D SCREEN BREAST BILATERAL    Standing Status:   Future    Standing Expiration Date:   09/11/2022    Order Specific Question:   Reason for Exam (SYMPTOM  OR DIAGNOSIS REQUIRED)    Answer:   screening/hx breast cancer    Order Specific Question:   Preferred imaging location?    Answer:   [facility]   CBC with Differential/Platelet    Standing Status:   Future    Standing Expiration Date:   09/11/2022   Comprehensive metabolic panel    Standing Status:   Future    Standing Expiration Date:   09/11/2022   Cancer antigen 27.29    Standing Status:   Future    Standing Expiration Date:   09/11/2022   All questions were answered. The patient knows to call the clinic with any problems, questions or concerns.      Cammie Sickle, MD 09/11/2021 10:45 AM

## 2021-09-11 NOTE — Assessment & Plan Note (Signed)
#  Stage II ER negative HER-2/neu negative left breast cancer status post chemotherapy [2012]; radiation. Nov 2022- Mammogram; STABLE.  Again reviewed that patient is essentially cured of her breast cancer.  However need to continue surveillance of new breast cancers.  # OCT 2021 BMD [PCP]- OSTEOPENIA-  Pt wants holistic approach; will wait to repeat BMD with PCP in 2023/PCP.    # DISPOSITION:  # follow up in 12 months/ labs-cbc/cmp/ca-27-29/ Mammogram prior- Dr.B  Cc; Ms.McLaughlin.

## 2021-09-12 LAB — CANCER ANTIGEN 27.29: CA 27.29: 21 U/mL (ref 0.0–38.6)

## 2021-09-24 DIAGNOSIS — E538 Deficiency of other specified B group vitamins: Secondary | ICD-10-CM | POA: Diagnosis not present

## 2021-10-28 DIAGNOSIS — M25512 Pain in left shoulder: Secondary | ICD-10-CM | POA: Diagnosis not present

## 2021-10-28 DIAGNOSIS — E538 Deficiency of other specified B group vitamins: Secondary | ICD-10-CM | POA: Diagnosis not present

## 2021-10-28 DIAGNOSIS — M25531 Pain in right wrist: Secondary | ICD-10-CM | POA: Diagnosis not present

## 2021-11-03 DIAGNOSIS — M25531 Pain in right wrist: Secondary | ICD-10-CM | POA: Diagnosis not present

## 2021-11-12 DIAGNOSIS — M19012 Primary osteoarthritis, left shoulder: Secondary | ICD-10-CM | POA: Diagnosis not present

## 2021-11-12 DIAGNOSIS — M25512 Pain in left shoulder: Secondary | ICD-10-CM | POA: Diagnosis not present

## 2021-11-12 DIAGNOSIS — M654 Radial styloid tenosynovitis [de Quervain]: Secondary | ICD-10-CM | POA: Diagnosis not present

## 2021-11-12 DIAGNOSIS — M19131 Post-traumatic osteoarthritis, right wrist: Secondary | ICD-10-CM | POA: Diagnosis not present

## 2021-11-12 DIAGNOSIS — M542 Cervicalgia: Secondary | ICD-10-CM | POA: Diagnosis not present

## 2021-11-12 DIAGNOSIS — M5030 Other cervical disc degeneration, unspecified cervical region: Secondary | ICD-10-CM | POA: Diagnosis not present

## 2021-11-12 DIAGNOSIS — G8929 Other chronic pain: Secondary | ICD-10-CM | POA: Diagnosis not present

## 2021-11-12 DIAGNOSIS — M1811 Unilateral primary osteoarthritis of first carpometacarpal joint, right hand: Secondary | ICD-10-CM | POA: Diagnosis not present

## 2021-11-12 DIAGNOSIS — M25531 Pain in right wrist: Secondary | ICD-10-CM | POA: Diagnosis not present

## 2021-11-18 DIAGNOSIS — M542 Cervicalgia: Secondary | ICD-10-CM | POA: Diagnosis not present

## 2021-11-18 DIAGNOSIS — M25512 Pain in left shoulder: Secondary | ICD-10-CM | POA: Diagnosis not present

## 2021-11-23 DIAGNOSIS — M25512 Pain in left shoulder: Secondary | ICD-10-CM | POA: Diagnosis not present

## 2021-11-23 DIAGNOSIS — M542 Cervicalgia: Secondary | ICD-10-CM | POA: Diagnosis not present

## 2021-11-25 DIAGNOSIS — M25512 Pain in left shoulder: Secondary | ICD-10-CM | POA: Diagnosis not present

## 2021-11-25 DIAGNOSIS — M542 Cervicalgia: Secondary | ICD-10-CM | POA: Diagnosis not present

## 2021-11-30 DIAGNOSIS — M542 Cervicalgia: Secondary | ICD-10-CM | POA: Diagnosis not present

## 2021-11-30 DIAGNOSIS — E538 Deficiency of other specified B group vitamins: Secondary | ICD-10-CM | POA: Diagnosis not present

## 2021-11-30 DIAGNOSIS — M25512 Pain in left shoulder: Secondary | ICD-10-CM | POA: Diagnosis not present

## 2021-12-02 DIAGNOSIS — M25512 Pain in left shoulder: Secondary | ICD-10-CM | POA: Diagnosis not present

## 2021-12-02 DIAGNOSIS — M542 Cervicalgia: Secondary | ICD-10-CM | POA: Diagnosis not present

## 2021-12-07 DIAGNOSIS — M542 Cervicalgia: Secondary | ICD-10-CM | POA: Diagnosis not present

## 2021-12-07 DIAGNOSIS — M25512 Pain in left shoulder: Secondary | ICD-10-CM | POA: Diagnosis not present

## 2021-12-09 DIAGNOSIS — M542 Cervicalgia: Secondary | ICD-10-CM | POA: Diagnosis not present

## 2021-12-09 DIAGNOSIS — M25512 Pain in left shoulder: Secondary | ICD-10-CM | POA: Diagnosis not present

## 2021-12-14 DIAGNOSIS — M1811 Unilateral primary osteoarthritis of first carpometacarpal joint, right hand: Secondary | ICD-10-CM | POA: Diagnosis not present

## 2021-12-14 DIAGNOSIS — E538 Deficiency of other specified B group vitamins: Secondary | ICD-10-CM | POA: Diagnosis not present

## 2021-12-14 DIAGNOSIS — M79641 Pain in right hand: Secondary | ICD-10-CM | POA: Diagnosis not present

## 2021-12-14 DIAGNOSIS — E782 Mixed hyperlipidemia: Secondary | ICD-10-CM | POA: Diagnosis not present

## 2021-12-14 DIAGNOSIS — G8929 Other chronic pain: Secondary | ICD-10-CM | POA: Diagnosis not present

## 2021-12-14 DIAGNOSIS — M8580 Other specified disorders of bone density and structure, unspecified site: Secondary | ICD-10-CM | POA: Diagnosis not present

## 2021-12-14 DIAGNOSIS — M19131 Post-traumatic osteoarthritis, right wrist: Secondary | ICD-10-CM | POA: Diagnosis not present

## 2021-12-14 DIAGNOSIS — M19012 Primary osteoarthritis, left shoulder: Secondary | ICD-10-CM | POA: Diagnosis not present

## 2021-12-14 DIAGNOSIS — I10 Essential (primary) hypertension: Secondary | ICD-10-CM | POA: Diagnosis not present

## 2021-12-14 DIAGNOSIS — M5030 Other cervical disc degeneration, unspecified cervical region: Secondary | ICD-10-CM | POA: Diagnosis not present

## 2021-12-14 DIAGNOSIS — M25531 Pain in right wrist: Secondary | ICD-10-CM | POA: Diagnosis not present

## 2021-12-14 DIAGNOSIS — Z9989 Dependence on other enabling machines and devices: Secondary | ICD-10-CM | POA: Diagnosis not present

## 2021-12-14 DIAGNOSIS — M7989 Other specified soft tissue disorders: Secondary | ICD-10-CM | POA: Diagnosis not present

## 2021-12-14 DIAGNOSIS — M654 Radial styloid tenosynovitis [de Quervain]: Secondary | ICD-10-CM | POA: Diagnosis not present

## 2021-12-14 DIAGNOSIS — G4733 Obstructive sleep apnea (adult) (pediatric): Secondary | ICD-10-CM | POA: Diagnosis not present

## 2021-12-14 DIAGNOSIS — M542 Cervicalgia: Secondary | ICD-10-CM | POA: Diagnosis not present

## 2021-12-14 DIAGNOSIS — M79642 Pain in left hand: Secondary | ICD-10-CM | POA: Diagnosis not present

## 2021-12-14 DIAGNOSIS — M25512 Pain in left shoulder: Secondary | ICD-10-CM | POA: Diagnosis not present

## 2021-12-14 DIAGNOSIS — M2550 Pain in unspecified joint: Secondary | ICD-10-CM | POA: Diagnosis not present

## 2021-12-22 DIAGNOSIS — Z171 Estrogen receptor negative status [ER-]: Secondary | ICD-10-CM | POA: Diagnosis not present

## 2021-12-22 DIAGNOSIS — M25512 Pain in left shoulder: Secondary | ICD-10-CM | POA: Diagnosis not present

## 2021-12-22 DIAGNOSIS — M8580 Other specified disorders of bone density and structure, unspecified site: Secondary | ICD-10-CM | POA: Diagnosis not present

## 2021-12-22 DIAGNOSIS — Z0000 Encounter for general adult medical examination without abnormal findings: Secondary | ICD-10-CM | POA: Diagnosis not present

## 2021-12-22 DIAGNOSIS — Z780 Asymptomatic menopausal state: Secondary | ICD-10-CM | POA: Diagnosis not present

## 2021-12-22 DIAGNOSIS — C50812 Malignant neoplasm of overlapping sites of left female breast: Secondary | ICD-10-CM | POA: Diagnosis not present

## 2021-12-22 DIAGNOSIS — E782 Mixed hyperlipidemia: Secondary | ICD-10-CM | POA: Diagnosis not present

## 2021-12-22 DIAGNOSIS — Z9989 Dependence on other enabling machines and devices: Secondary | ICD-10-CM | POA: Diagnosis not present

## 2021-12-22 DIAGNOSIS — Z1211 Encounter for screening for malignant neoplasm of colon: Secondary | ICD-10-CM | POA: Diagnosis not present

## 2021-12-22 DIAGNOSIS — R748 Abnormal levels of other serum enzymes: Secondary | ICD-10-CM | POA: Diagnosis not present

## 2021-12-22 DIAGNOSIS — I10 Essential (primary) hypertension: Secondary | ICD-10-CM | POA: Diagnosis not present

## 2021-12-22 DIAGNOSIS — M542 Cervicalgia: Secondary | ICD-10-CM | POA: Diagnosis not present

## 2021-12-22 DIAGNOSIS — R768 Other specified abnormal immunological findings in serum: Secondary | ICD-10-CM | POA: Diagnosis not present

## 2021-12-22 DIAGNOSIS — G4733 Obstructive sleep apnea (adult) (pediatric): Secondary | ICD-10-CM | POA: Diagnosis not present

## 2021-12-29 ENCOUNTER — Other Ambulatory Visit: Payer: Self-pay | Admitting: Physician Assistant

## 2021-12-29 DIAGNOSIS — R748 Abnormal levels of other serum enzymes: Secondary | ICD-10-CM

## 2021-12-31 DIAGNOSIS — E538 Deficiency of other specified B group vitamins: Secondary | ICD-10-CM | POA: Diagnosis not present

## 2022-01-04 DIAGNOSIS — M5030 Other cervical disc degeneration, unspecified cervical region: Secondary | ICD-10-CM | POA: Diagnosis not present

## 2022-01-04 DIAGNOSIS — M19012 Primary osteoarthritis, left shoulder: Secondary | ICD-10-CM | POA: Diagnosis not present

## 2022-01-04 DIAGNOSIS — M19031 Primary osteoarthritis, right wrist: Secondary | ICD-10-CM | POA: Diagnosis not present

## 2022-01-04 DIAGNOSIS — R768 Other specified abnormal immunological findings in serum: Secondary | ICD-10-CM | POA: Diagnosis not present

## 2022-01-04 DIAGNOSIS — M19042 Primary osteoarthritis, left hand: Secondary | ICD-10-CM | POA: Diagnosis not present

## 2022-01-04 DIAGNOSIS — M19041 Primary osteoarthritis, right hand: Secondary | ICD-10-CM | POA: Diagnosis not present

## 2022-01-12 ENCOUNTER — Ambulatory Visit
Admission: RE | Admit: 2022-01-12 | Discharge: 2022-01-12 | Disposition: A | Discharge status: Home / Self Care | Payer: PPO | Source: Ambulatory Visit | Attending: Physician Assistant | Admitting: Physician Assistant

## 2022-01-12 DIAGNOSIS — R748 Abnormal levels of other serum enzymes: Secondary | ICD-10-CM | POA: Diagnosis not present

## 2022-02-03 DIAGNOSIS — E538 Deficiency of other specified B group vitamins: Secondary | ICD-10-CM | POA: Diagnosis not present

## 2022-02-15 DIAGNOSIS — G8929 Other chronic pain: Secondary | ICD-10-CM | POA: Diagnosis not present

## 2022-02-15 DIAGNOSIS — M19012 Primary osteoarthritis, left shoulder: Secondary | ICD-10-CM | POA: Diagnosis not present

## 2022-02-15 DIAGNOSIS — M25512 Pain in left shoulder: Secondary | ICD-10-CM | POA: Diagnosis not present

## 2022-02-15 DIAGNOSIS — M65812 Other synovitis and tenosynovitis, left shoulder: Secondary | ICD-10-CM | POA: Diagnosis not present

## 2022-03-09 DIAGNOSIS — M79642 Pain in left hand: Secondary | ICD-10-CM | POA: Diagnosis not present

## 2022-03-09 DIAGNOSIS — M79641 Pain in right hand: Secondary | ICD-10-CM | POA: Diagnosis not present

## 2022-03-10 DIAGNOSIS — E538 Deficiency of other specified B group vitamins: Secondary | ICD-10-CM | POA: Diagnosis not present

## 2022-03-29 DIAGNOSIS — M65812 Other synovitis and tenosynovitis, left shoulder: Secondary | ICD-10-CM | POA: Diagnosis not present

## 2022-03-29 DIAGNOSIS — M19131 Post-traumatic osteoarthritis, right wrist: Secondary | ICD-10-CM | POA: Diagnosis not present

## 2022-03-29 DIAGNOSIS — M1811 Unilateral primary osteoarthritis of first carpometacarpal joint, right hand: Secondary | ICD-10-CM | POA: Diagnosis not present

## 2022-03-29 DIAGNOSIS — M25512 Pain in left shoulder: Secondary | ICD-10-CM | POA: Diagnosis not present

## 2022-03-29 DIAGNOSIS — M5030 Other cervical disc degeneration, unspecified cervical region: Secondary | ICD-10-CM | POA: Diagnosis not present

## 2022-03-29 DIAGNOSIS — M19012 Primary osteoarthritis, left shoulder: Secondary | ICD-10-CM | POA: Diagnosis not present

## 2022-03-29 DIAGNOSIS — M25531 Pain in right wrist: Secondary | ICD-10-CM | POA: Diagnosis not present

## 2022-03-29 DIAGNOSIS — G8929 Other chronic pain: Secondary | ICD-10-CM | POA: Diagnosis not present

## 2022-04-12 DIAGNOSIS — E538 Deficiency of other specified B group vitamins: Secondary | ICD-10-CM | POA: Diagnosis not present

## 2022-05-06 DIAGNOSIS — R768 Other specified abnormal immunological findings in serum: Secondary | ICD-10-CM | POA: Diagnosis not present

## 2022-05-06 DIAGNOSIS — M159 Polyosteoarthritis, unspecified: Secondary | ICD-10-CM | POA: Diagnosis not present

## 2022-05-10 DIAGNOSIS — M5030 Other cervical disc degeneration, unspecified cervical region: Secondary | ICD-10-CM | POA: Diagnosis not present

## 2022-05-10 DIAGNOSIS — M19131 Post-traumatic osteoarthritis, right wrist: Secondary | ICD-10-CM | POA: Diagnosis not present

## 2022-05-10 DIAGNOSIS — G8929 Other chronic pain: Secondary | ICD-10-CM | POA: Diagnosis not present

## 2022-05-10 DIAGNOSIS — M25531 Pain in right wrist: Secondary | ICD-10-CM | POA: Diagnosis not present

## 2022-05-10 DIAGNOSIS — M65812 Other synovitis and tenosynovitis, left shoulder: Secondary | ICD-10-CM | POA: Diagnosis not present

## 2022-05-10 DIAGNOSIS — M2550 Pain in unspecified joint: Secondary | ICD-10-CM | POA: Diagnosis not present

## 2022-05-10 DIAGNOSIS — M19012 Primary osteoarthritis, left shoulder: Secondary | ICD-10-CM | POA: Diagnosis not present

## 2022-05-10 DIAGNOSIS — M25512 Pain in left shoulder: Secondary | ICD-10-CM | POA: Diagnosis not present

## 2022-05-10 DIAGNOSIS — M1811 Unilateral primary osteoarthritis of first carpometacarpal joint, right hand: Secondary | ICD-10-CM | POA: Diagnosis not present

## 2022-05-13 DIAGNOSIS — E538 Deficiency of other specified B group vitamins: Secondary | ICD-10-CM | POA: Diagnosis not present

## 2022-06-16 DIAGNOSIS — I10 Essential (primary) hypertension: Secondary | ICD-10-CM | POA: Diagnosis not present

## 2022-06-16 DIAGNOSIS — E782 Mixed hyperlipidemia: Secondary | ICD-10-CM | POA: Diagnosis not present

## 2022-06-16 DIAGNOSIS — E538 Deficiency of other specified B group vitamins: Secondary | ICD-10-CM | POA: Diagnosis not present

## 2022-06-30 DIAGNOSIS — M8580 Other specified disorders of bone density and structure, unspecified site: Secondary | ICD-10-CM | POA: Diagnosis not present

## 2022-06-30 DIAGNOSIS — Z0000 Encounter for general adult medical examination without abnormal findings: Secondary | ICD-10-CM | POA: Diagnosis not present

## 2022-06-30 DIAGNOSIS — Z23 Encounter for immunization: Secondary | ICD-10-CM | POA: Diagnosis not present

## 2022-06-30 DIAGNOSIS — E538 Deficiency of other specified B group vitamins: Secondary | ICD-10-CM | POA: Diagnosis not present

## 2022-06-30 DIAGNOSIS — C50812 Malignant neoplasm of overlapping sites of left female breast: Secondary | ICD-10-CM | POA: Diagnosis not present

## 2022-06-30 DIAGNOSIS — R748 Abnormal levels of other serum enzymes: Secondary | ICD-10-CM | POA: Diagnosis not present

## 2022-06-30 DIAGNOSIS — E782 Mixed hyperlipidemia: Secondary | ICD-10-CM | POA: Diagnosis not present

## 2022-06-30 DIAGNOSIS — I10 Essential (primary) hypertension: Secondary | ICD-10-CM | POA: Diagnosis not present

## 2022-06-30 DIAGNOSIS — Z171 Estrogen receptor negative status [ER-]: Secondary | ICD-10-CM | POA: Diagnosis not present

## 2022-06-30 DIAGNOSIS — Z1231 Encounter for screening mammogram for malignant neoplasm of breast: Secondary | ICD-10-CM | POA: Diagnosis not present

## 2022-06-30 DIAGNOSIS — G4733 Obstructive sleep apnea (adult) (pediatric): Secondary | ICD-10-CM | POA: Diagnosis not present

## 2022-07-13 DIAGNOSIS — G4733 Obstructive sleep apnea (adult) (pediatric): Secondary | ICD-10-CM | POA: Diagnosis not present

## 2022-07-15 ENCOUNTER — Ambulatory Visit: Payer: PPO | Admitting: Dermatology

## 2022-07-21 DIAGNOSIS — Z86010 Personal history of colonic polyps: Secondary | ICD-10-CM | POA: Diagnosis not present

## 2022-07-21 DIAGNOSIS — E538 Deficiency of other specified B group vitamins: Secondary | ICD-10-CM | POA: Diagnosis not present

## 2022-07-23 ENCOUNTER — Ambulatory Visit (INDEPENDENT_AMBULATORY_CARE_PROVIDER_SITE_OTHER): Payer: PPO | Admitting: Dermatology

## 2022-07-23 DIAGNOSIS — L821 Other seborrheic keratosis: Secondary | ICD-10-CM

## 2022-07-23 DIAGNOSIS — L578 Other skin changes due to chronic exposure to nonionizing radiation: Secondary | ICD-10-CM

## 2022-07-23 DIAGNOSIS — D229 Melanocytic nevi, unspecified: Secondary | ICD-10-CM | POA: Diagnosis not present

## 2022-07-23 DIAGNOSIS — Z1283 Encounter for screening for malignant neoplasm of skin: Secondary | ICD-10-CM | POA: Diagnosis not present

## 2022-07-23 DIAGNOSIS — Z79899 Other long term (current) drug therapy: Secondary | ICD-10-CM | POA: Diagnosis not present

## 2022-07-23 DIAGNOSIS — Z853 Personal history of malignant neoplasm of breast: Secondary | ICD-10-CM | POA: Diagnosis not present

## 2022-07-23 DIAGNOSIS — L814 Other melanin hyperpigmentation: Secondary | ICD-10-CM | POA: Diagnosis not present

## 2022-07-23 DIAGNOSIS — R21 Rash and other nonspecific skin eruption: Secondary | ICD-10-CM

## 2022-07-23 DIAGNOSIS — Q825 Congenital non-neoplastic nevus: Secondary | ICD-10-CM

## 2022-07-23 DIAGNOSIS — L309 Dermatitis, unspecified: Secondary | ICD-10-CM | POA: Diagnosis not present

## 2022-07-23 DIAGNOSIS — L2089 Other atopic dermatitis: Secondary | ICD-10-CM

## 2022-07-23 MED ORDER — EUCRISA 2 % EX OINT: 1.0000 "application " | TOPICAL_OINTMENT | CUTANEOUS | 11 refills | Status: AC

## 2022-07-23 MED ORDER — MOMETASONE FUROATE 0.1 % EX CREA: 1.0000 | TOPICAL_CREAM | CUTANEOUS | 11 refills | Status: DC

## 2022-07-23 NOTE — Patient Instructions (Signed)
Due to recent changes in healthcare laws, you may see results of your pathology and/or laboratory studies on MyChart before the doctors have had a chance to review them. We understand that in some cases there may be results that are confusing or concerning to you. Please understand that not all results are received at the same time and often the doctors may need to interpret multiple results in order to provide you with the best plan of care or course of treatment. Therefore, we ask that you please give us 2 business days to thoroughly review all your results before contacting the office for clarification. Should we see a critical lab result, you will be contacted sooner.   If You Need Anything After Your Visit  If you have any questions or concerns for your doctor, please call our main line at [phone] and press option 4 to reach your doctor's medical assistant. If no one answers, please leave a voicemail as directed and we will return your call as soon as possible. Messages left after 4 pm will be answered the following business day.   You may also send us a message via MyChart. We typically respond to MyChart messages within 1-2 business days.  For prescription refills, please ask your pharmacy to contact our office. Our fax number is [phone].  If you have an urgent issue when the clinic is closed that cannot wait until the next business day, you can page your doctor at the number below.    Please note that while we do our best to be available for urgent issues outside of office hours, we are not available 24/7.   If you have an urgent issue and are unable to reach us, you may choose to seek medical care at your doctor's office, retail clinic, urgent care center, or emergency room.  If you have a medical emergency, please immediately call 911 or go to the emergency department.  Pager Numbers  - Dr. Kowalski: [phone]  - Dr. Moye: [phone]  - Dr. Stewart:  [phone]  In the event of inclement weather, please call our main line at [phone] for an update on the status of any delays or closures.  Dermatology Medication Tips: Please keep the boxes that topical medications come in in order to help keep track of the instructions about where and how to use these. Pharmacies typically print the medication instructions only on the boxes and not directly on the medication tubes.   If your medication is too expensive, please contact our office at [phone] option 4 or send us a message through MyChart.   We are unable to tell what your co-pay for medications will be in advance as this is different depending on your insurance coverage. However, we may be able to find a substitute medication at lower cost or fill out paperwork to get insurance to cover a needed medication.   If a prior authorization is required to get your medication covered by your insurance company, please allow us 1-2 business days to complete this process.  Drug prices often vary depending on where the prescription is filled and some pharmacies may offer cheaper prices.  The website www.goodrx.com contains coupons for medications through different pharmacies. The prices here do not account for what the cost may be with help from insurance (it may be cheaper with your insurance), but the website can give you the price if you did not use any insurance.  - You can print the associated coupon and take it with   your prescription to the pharmacy.  - You may also stop by our office during regular business hours and pick up a GoodRx coupon card.  - If you need your prescription sent electronically to a different pharmacy, notify our office through [facility] MyChart or by phone at [phone] option 4.     Si Usted Necesita Algo Despus de Su Visita  Tambin puede enviarnos un mensaje a travs de MyChart. Por lo general respondemos a los mensajes de MyChart en el transcurso de 1 a 2  das hbiles.  Para renovar recetas, por favor pida a su farmacia que se ponga en contacto con nuestra oficina. Nuestro nmero de fax es el [phone].  Si tiene un asunto urgente cuando la clnica est cerrada y que no puede esperar hasta el siguiente da hbil, puede llamar/localizar a su doctor(a) al nmero que aparece a continuacin.   Por favor, tenga en cuenta que aunque hacemos todo lo posible para estar disponibles para asuntos urgentes fuera del horario de oficina, no estamos disponibles las 24 horas del da, los 7 das de la semana.   Si tiene un problema urgente y no puede comunicarse con nosotros, puede optar por buscar atencin mdica  en el consultorio de su doctor(a), en una clnica privada, en un centro de atencin urgente o en una sala de emergencias.  Si tiene una emergencia mdica, por favor llame inmediatamente al 911 o vaya a la sala de emergencias.  Nmeros de bper  - Dr. Kowalski: [phone]  - Dra. Moye: [phone]  - Dra. Stewart: [phone]  En caso de inclemencias del tiempo, por favor llame a nuestra lnea principal al [phone] para una actualizacin sobre el estado de cualquier retraso o cierre.  Consejos para la medicacin en dermatologa: Por favor, guarde las cajas en las que vienen los medicamentos de uso tpico para ayudarle a seguir las instrucciones sobre dnde y cmo usarlos. Las farmacias generalmente imprimen las instrucciones del medicamento slo en las cajas y no directamente en los tubos del medicamento.   Si su medicamento es muy caro, por favor, pngase en contacto con nuestra oficina llamando al [phone] y presione la opcin 4 o envenos un mensaje a travs de MyChart.   No podemos decirle cul ser su copago por los medicamentos por adelantado ya que esto es diferente dependiendo de la cobertura de su seguro. Sin embargo, es posible que podamos encontrar un medicamento sustituto a menor costo o llenar un formulario para que el  seguro cubra el medicamento que se considera necesario.   Si se requiere una autorizacin previa para que su compaa de seguros cubra su medicamento, por favor permtanos de 1 a 2 das hbiles para completar este proceso.  Los precios de los medicamentos varan con frecuencia dependiendo del lugar de dnde se surte la receta y alguna farmacias pueden ofrecer precios ms baratos.  El sitio web www.goodrx.com tiene cupones para medicamentos de diferentes farmacias. Los precios aqu no tienen en cuenta lo que podra costar con la ayuda del seguro (puede ser ms barato con su seguro), pero el sitio web puede darle el precio si no utiliz ningn seguro.  - Puede imprimir el cupn correspondiente y llevarlo con su receta a la farmacia.  - Tambin puede pasar por nuestra oficina durante el horario de atencin regular y recoger una tarjeta de cupones de GoodRx.  - Si necesita que su receta se enve electrnicamente a una farmacia diferente, informe a nuestra oficina a travs de MyChart de [place]   o por telfono llamando al [phone] y presione la opcin 4.  

## 2022-07-23 NOTE — Progress Notes (Signed)
Follow-Up Visit   Subjective  Kelly Green is a 66 y.o. female who presents for the following: Annual Exam (Hx AKs). The patient presents for Total-Body Skin Exam (TBSE) for skin cancer screening and mole check.  The patient has spots, moles and lesions to be evaluated, some may be new or changing and the patient has concerns that these could be cancer. Patient has a rash on her buttocks.  The following portions of the chart were reviewed this encounter and updated as appropriate:   Tobacco  Allergies  Meds  Problems  Med Hx  Surg Hx  Fam Hx     Review of Systems:  No other skin or systemic complaints except as noted in HPI or Assessment and Plan.  Objective  Well appearing patient in no apparent distress; mood and affect are within normal limits.  A full examination was performed including scalp, head, eyes, ears, nose, lips, neck, chest, axillae, abdomen, back, buttocks, bilateral upper extremities, bilateral lower extremities, hands, feet, fingers, toes, fingernails, and toenails. All findings within normal limits unless otherwise noted below.  L buttocks 3.0 x 2.0 cm Oval patch.  Breasts Scar.  R thigh Vascular lesion.   Assessment & Plan  Rash and other nonspecific skin eruption Most consistent with eczema L buttocks Start Mometasone 0.1% cream to aa's QD PRN up to 5d/wk. Topical steroids (such as triamcinolone, fluocinolone, fluocinonide, mometasone, clobetasol, halobetasol, betamethasone, hydrocortisone) can cause thinning and lightening of the skin if they are used for too long in the same area. Your physician has selected the right strength medicine for your problem and area affected on the body. Please use your medication only as directed by your physician to prevent side effects.   History of breast cancer Breasts No lymphadenopathy. Continue screening exams.  Birth mark R thigh Vascular birth mark - Benign-appearing.  Observation.  Call clinic for new or  changing moles.  Recommend daily use of broad spectrum spf 30+ sunscreen to sun-exposed areas.    Hand eczema B/L hands Chronic and persistent condition with duration or expected duration over one year. Condition is symptomatic / bothersome to patient. Not to goal. Atopic dermatitis (eczema) is a chronic, relapsing, pruritic condition that can significantly affect quality of life. It is often associated with allergic rhinitis and/or asthma and can require treatment with topical medications, phototherapy, or in severe cases biologic injectable medication (Dupixent; Adbry) or Oral JAK inhibitors.  Continue Mometasone 0.1% cream to aa's QD PRN up to 5d/wk. Topical steroids (such as triamcinolone, fluocinolone, fluocinonide, mometasone, clobetasol, halobetasol, betamethasone, hydrocortisone) can cause thinning and lightening of the skin if they are used for too long in the same area. Your physician has selected the right strength medicine for your problem and area affected on the body. Please use your medication only as directed by your physician to prevent side effects.  Continue Eucrisa 2% ointment to aa's QD PRN.  Related Medications mometasone (ELOCON) 0.1 % cream Apply 1 Application topically as directed. Qd to bid aa itchy rash on neck and hands for 2 weeks then d/c Crisaborole (EUCRISA) 2 % OINT Apply 1 application  topically as directed. Qd to bid aa itchy rash on hands and posterior neck until clear, then prn flares  Lentigines - Scattered tan macules - Due to sun exposure - Benign-appearing, observe - Recommend daily broad spectrum sunscreen SPF 30+ to sun-exposed areas, reapply every 2 hours as needed. - Call for any changes  Seborrheic Keratoses - Stuck-on, waxy, tan-Couzens papules  and/or plaques  - Benign-appearing - Discussed benign etiology and prognosis. - Observe - Call for any changes  Melanocytic Nevi - Tan-Jeng and/or pink-flesh-colored symmetric macules and papules -  Benign appearing on exam today - Observation - Call clinic for new or changing moles - Recommend daily use of broad spectrum spf 30+ sunscreen to sun-exposed areas.   Hemangiomas - Red papules - Discussed benign nature - Observe - Call for any changes  Actinic Damage - Chronic condition, secondary to cumulative UV/sun exposure - diffuse scaly erythematous macules with underlying dyspigmentation - Recommend daily broad spectrum sunscreen SPF 30+ to sun-exposed areas, reapply every 2 hours as needed.  - Staying in the shade or wearing long sleeves, sun glasses (UVA+UVB protection) and wide brim hats (4-inch brim around the entire circumference of the hat) are also recommended for sun protection.  - Call for new or changing lesions.  Skin cancer screening performed today.  Return in about 1 year (around 07/24/2023) for TBSE.  Luther Redo, CMA, am acting as scribe for Sarina Ser, MD . Documentation: I have reviewed the above documentation for accuracy and completeness, and I agree with the above.  Sarina Ser, MD

## 2022-07-31 ENCOUNTER — Encounter: Payer: Self-pay | Admitting: Dermatology

## 2022-08-09 ENCOUNTER — Ambulatory Visit
Admission: RE | Admit: 2022-08-09 | Discharge: 2022-08-09 | Disposition: A | Discharge status: Home / Self Care | Payer: PPO | Source: Ambulatory Visit | Attending: Internal Medicine | Admitting: Internal Medicine

## 2022-08-09 DIAGNOSIS — C50812 Malignant neoplasm of overlapping sites of left female breast: Secondary | ICD-10-CM | POA: Diagnosis not present

## 2022-08-09 DIAGNOSIS — Z1231 Encounter for screening mammogram for malignant neoplasm of breast: Secondary | ICD-10-CM | POA: Insufficient documentation

## 2022-08-09 DIAGNOSIS — Z171 Estrogen receptor negative status [ER-]: Secondary | ICD-10-CM | POA: Insufficient documentation

## 2022-08-11 ENCOUNTER — Other Ambulatory Visit: Payer: Self-pay | Admitting: Internal Medicine

## 2022-08-11 DIAGNOSIS — R928 Other abnormal and inconclusive findings on diagnostic imaging of breast: Secondary | ICD-10-CM

## 2022-08-13 ENCOUNTER — Ambulatory Visit
Admission: RE | Admit: 2022-08-13 | Discharge: 2022-08-13 | Disposition: A | Discharge status: Home / Self Care | Payer: PPO | Source: Ambulatory Visit | Attending: Internal Medicine | Admitting: Internal Medicine

## 2022-08-13 DIAGNOSIS — R928 Other abnormal and inconclusive findings on diagnostic imaging of breast: Secondary | ICD-10-CM

## 2022-08-13 DIAGNOSIS — R92331 Mammographic heterogeneous density, right breast: Secondary | ICD-10-CM | POA: Diagnosis not present

## 2022-08-18 DIAGNOSIS — R768 Other specified abnormal immunological findings in serum: Secondary | ICD-10-CM | POA: Diagnosis not present

## 2022-08-18 DIAGNOSIS — M159 Polyosteoarthritis, unspecified: Secondary | ICD-10-CM | POA: Diagnosis not present

## 2022-08-18 DIAGNOSIS — M24812 Other specific joint derangements of left shoulder, not elsewhere classified: Secondary | ICD-10-CM | POA: Diagnosis not present

## 2022-08-18 DIAGNOSIS — M5030 Other cervical disc degeneration, unspecified cervical region: Secondary | ICD-10-CM | POA: Diagnosis not present

## 2022-08-18 DIAGNOSIS — M7989 Other specified soft tissue disorders: Secondary | ICD-10-CM | POA: Diagnosis not present

## 2022-08-25 DIAGNOSIS — E538 Deficiency of other specified B group vitamins: Secondary | ICD-10-CM | POA: Diagnosis not present

## 2022-08-26 ENCOUNTER — Other Ambulatory Visit: Payer: Self-pay | Admitting: Rheumatology

## 2022-08-26 DIAGNOSIS — M159 Polyosteoarthritis, unspecified: Secondary | ICD-10-CM

## 2022-08-26 DIAGNOSIS — M0579 Rheumatoid arthritis with rheumatoid factor of multiple sites without organ or systems involvement: Secondary | ICD-10-CM | POA: Diagnosis not present

## 2022-08-26 DIAGNOSIS — Z7960 Long term (current) use of unspecified immunomodulators and immunosuppressants: Secondary | ICD-10-CM | POA: Diagnosis not present

## 2022-09-10 ENCOUNTER — Encounter: Payer: Self-pay | Admitting: Internal Medicine

## 2022-09-10 ENCOUNTER — Inpatient Hospital Stay: Payer: PPO | Admitting: Internal Medicine

## 2022-09-10 ENCOUNTER — Inpatient Hospital Stay: Payer: PPO | Attending: Internal Medicine

## 2022-09-10 VITALS — BP 144/88 | HR 80 | Temp 97.4°F | Resp 16 | Wt 154.8 lb

## 2022-09-10 DIAGNOSIS — M069 Rheumatoid arthritis, unspecified: Secondary | ICD-10-CM | POA: Insufficient documentation

## 2022-09-10 DIAGNOSIS — Z171 Estrogen receptor negative status [ER-]: Secondary | ICD-10-CM | POA: Diagnosis not present

## 2022-09-10 DIAGNOSIS — Z853 Personal history of malignant neoplasm of breast: Secondary | ICD-10-CM | POA: Diagnosis not present

## 2022-09-10 DIAGNOSIS — C50812 Malignant neoplasm of overlapping sites of left female breast: Secondary | ICD-10-CM | POA: Diagnosis not present

## 2022-09-10 DIAGNOSIS — Z08 Encounter for follow-up examination after completed treatment for malignant neoplasm: Secondary | ICD-10-CM | POA: Diagnosis not present

## 2022-09-10 LAB — COMPREHENSIVE METABOLIC PANEL
ALT: 15 U/L (ref 0–44)
AST: 22 U/L (ref 15–41)
Albumin: 4.4 g/dL (ref 3.5–5.0)
Alkaline Phosphatase: 114 U/L (ref 38–126)
Anion gap: 9 (ref 5–15)
BUN: 18 mg/dL (ref 8–23)
CO2: 28 mmol/L (ref 22–32)
Calcium: 9.4 mg/dL (ref 8.9–10.3)
Chloride: 98 mmol/L (ref 98–111)
Creatinine, Ser: 0.59 mg/dL (ref 0.44–1.00)
GFR, Estimated: >60 mL/min (ref >60)
Glucose, Bld: 109 mg/dL — ABNORMAL HIGH (ref 70–99)
Potassium: 4.1 mmol/L (ref 3.5–5.1)
Sodium: 135 mmol/L (ref 135–145)
Total Bilirubin: 0.8 mg/dL (ref 0.3–1.2)
Total Protein: 7.2 g/dL (ref 6.5–8.1)

## 2022-09-10 LAB — CBC WITH DIFFERENTIAL/PLATELET
Abs Immature Granulocytes: 0.03 10*3/uL (ref 0.00–0.07)
Basophils Absolute: 0 10*3/uL (ref 0.0–0.1)
Basophils Relative: 0 %
Eosinophils Absolute: 0.2 10*3/uL (ref 0.0–0.5)
Eosinophils Relative: 3 %
HCT: 36.1 % (ref 36.0–46.0)
Hemoglobin: 12.4 g/dL (ref 12.0–15.0)
Immature Granulocytes: 1 %
Lymphocytes Relative: 37 %
Lymphs Abs: 2 10*3/uL (ref 0.7–4.0)
MCH: 27.1 pg (ref 26.0–34.0)
MCHC: 34.3 g/dL (ref 30.0–36.0)
MCV: 78.8 fL — ABNORMAL LOW (ref 80.0–100.0)
Monocytes Absolute: 0.4 10*3/uL (ref 0.1–1.0)
Monocytes Relative: 7 %
Neutro Abs: 2.8 10*3/uL (ref 1.7–7.7)
Neutrophils Relative %: 52 %
Platelets: 185 10*3/uL (ref 150–400)
RBC: 4.58 MIL/uL (ref 3.87–5.11)
RDW: 12.4 % (ref 11.5–15.5)
WBC: 5.5 10*3/uL (ref 4.0–10.5)
nRBC: 0 % (ref 0.0–0.2)

## 2022-09-10 NOTE — Assessment & Plan Note (Addendum)
#  Stage II ER negative HER-2/neu negative left breast cancer status post chemotherapy [2012]; radiation.  November 2023 -screening mammogram: Mild abnormality noted on the right breast.  DEC 2023 Right - Mammogram-diagnostic negative for any concerns.   Again reviewed that patient is essentially cured of her breast cancer.  However need to continue surveillance of new breast cancers.  # OCT 2021 BMD [PCP]- OSTEOPENIA-  Pt wants holistic approach; will wait to repeat BMD with PCP.   # Rheumatoid arthritis [Shoulders- Dr.Patel]- ok to take from Oncology stand point.   # Vaccination: s/p flu shot; discussed regarding COVID vaccination.   # DISPOSITION:  # follow up in 12 months/ labs-cbc/cmp/ca-27-29/ BILATERAL screening Mammogram prior- Dr.B  Cc; Ms.McLaughlin.

## 2022-09-10 NOTE — Progress Notes (Signed)
[facility] OFFICE PROGRESS NOTE  Patient Care Team: Marinda Elk, MD as PCP - General (Physician Assistant)   Cancer Staging  No matching staging information was found for the patient.   Oncology History Overview Note  # 2011- stage pII pT2 pN0 ( SN) M0  invasive ductal carcinoma of left breast , ER 0%, PR  1%, her2/neu not overexpressed who is s/p chemotherapy per protocol NSABP 47 Arm 1-A TC X 6 which she was randomized 08/27/10.  She has completed radiation to left breast per Dr. Baruch Gouty in June 2012  2.BRCA1 and BRCA2 mutations are not identified (December, 2014)  #Obstructive sleep apnea/CPAP 2019.   DIAGNOSIS: breast cancer  STAGE:   II     ;GOALS: cure  CURRENT/MOST RECENT THERAPY: surveillaince       Carcinoma of overlapping sites of left breast in female, estrogen receptor negative ([place])  12/05/2019 Genetic Testing   Negative genetic testing on the common hereditary cancer panel. The Common Hereditary Gene Panel offered by [facility] includes sequencing and/or deletion duplication testing of the following 48 genes: APC, ATM, AXIN2, BARD1, BMPR1A, BRCA1, BRCA2, BRIP1, CDH1, CDK4, CDKN2A (p14ARF), CDKN2A (p16INK4a), CHEK2, CTNNA1, DICER1, EPCAM (Deletion/duplication testing only), GREM1 (promoter region deletion/duplication testing only), KIT, MEN1, MLH1, MSH2, MSH3, MSH6, MUTYH, NBN, NF1, NHTL1, PALB2, PDGFRA, PMS2, POLD1, POLE, PTEN, RAD50, RAD51C, RAD51D, RNF43, SDHB, SDHC, SDHD, SMAD4, SMARCA4. STK11, TP53, TSC1, TSC2, and VHL.  The following genes were evaluated for sequence changes only: SDHA and HOXB13 c.251G>A variant only. The report date is December 05, 2019.       INTERVAL HISTORY: Alone.  Ambulating independently.  Kelly Green 66 y.o.  female pleasant patient above history of triple negative breast cancer stage II currently under surveillance is here for follow-up.  Now being followed by rheumatologist for rheumatoid arthritis. Has started  Hydroxychloroquine for a few months but would like to discuss methotrexate before starting.   Continues to chronic joint pains not any worse.  No new shortness of breath or cough.  Review of Systems  Constitutional:  Negative for chills, diaphoresis, fever, malaise/fatigue and weight loss.  HENT:  Negative for nosebleeds and sore throat.   Eyes:  Negative for double vision.  Respiratory:  Negative for cough, hemoptysis, sputum production, shortness of breath and wheezing.   Cardiovascular:  Negative for chest pain, palpitations, orthopnea and leg swelling.  Gastrointestinal:  Negative for abdominal pain, blood in stool, constipation, diarrhea, heartburn, melena, nausea and vomiting.  Genitourinary:  Negative for dysuria, frequency and urgency.  Musculoskeletal:  Positive for back pain and joint pain.  Skin: Negative.  Negative for itching and rash.  Neurological:  Negative for dizziness, tingling, focal weakness, weakness and headaches.  Endo/Heme/Allergies:  Does not bruise/bleed easily.  Psychiatric/Behavioral:  Negative for depression. The patient is not nervous/anxious and does not have insomnia.      PAST MEDICAL HISTORY :  Past Medical History:  Diagnosis Date   Actinic keratosis    Anemia    iron def.anemia   Breast cancer ([place]) 2011   left breast with lumpectomy, chemo and rad tx   Family history of brain cancer    Family history of kidney cancer    Hyperlipemia    Hypertension    Hyperthyroidism    Personal history of chemotherapy    Personal history of radiation therapy    Scoliosis, adolescent acquired    Vitamin B 12 deficiency     PAST SURGICAL HISTORY :  Past Surgical History:  Procedure Laterality Date   APPENDECTOMY     BREAST BIOPSY Left 2011   neg with concordant   BREAST CYST ASPIRATION Bilateral    neg   BREAST LUMPECTOMY Left 2011   with radiation and chemo   COLONOSCOPY WITH PROPOFOL N/A 01/14/2017   Procedure: COLONOSCOPY WITH PROPOFOL;   Surgeon: Lollie Sails, MD;  Location[facility] ENDOSCOPY;  Service: Endoscopy;  Laterality: N/A;   Thyroid Goiter Removal  35 years agi    FAMILY HISTORY :   Family History  Problem Relation Age of Onset   Dementia Mother    Heart attack Father 81   Heart attack Maternal Aunt    Heart attack Maternal Grandmother    Dementia Maternal Grandfather    Brain cancer Cousin 33       pat first cousin   Cirrhosis Cousin        2 pat first cousins   Liver cancer Cousin        pat first cousin   Kidney cancer Cousin        pat first cousin   Breast cancer Cousin 51       mat first cousin's daughter    SOCIAL HISTORY:   Social History   Tobacco Use   Smoking status: Never   Smokeless tobacco: Never  Vaping Use   Vaping Use: Never used  Substance Use Topics   Alcohol use: No   Drug use: No    ALLERGIES:  is allergic to septra [sulfamethoxazole-trimethoprim].  MEDICATIONS:  Current Outpatient Medications  Medication Sig Dispense Refill   amLODipine (NORVASC) 5 MG tablet Take 5 mg by mouth daily.      Cholecalciferol 25 MCG (1000 UT) tablet Take by mouth.     Crisaborole (EUCRISA) 2 % OINT Apply 1 application  topically as directed. Qd to bid aa itchy rash on hands and posterior neck until clear, then prn flares 100 g 11   cyanocobalamin (VITAMIN B12) 1000 MCG/ML injection Inject into the muscle.     ferrous gluconate (FERGON) 240 (27 FE) MG tablet Take 240 mg by mouth daily.      hydrochlorothiazide (HYDRODIURIL) 25 MG tablet Take 1 tablet by mouth daily.     hydroxychloroquine (PLAQUENIL) 200 MG tablet Take 200 mg by mouth daily.     losartan (COZAAR) 100 MG tablet Take 1 tablet by mouth daily.     mometasone (ELOCON) 0.1 % cream Apply 1 Application topically as directed. Qd to bid aa itchy rash on neck and hands for 2 weeks then d/c 45 g 11   Multiple Minerals-Vitamins (CALCIUM & VIT D3 BONE HEALTH PO) Take by mouth.     Multiple Vitamin (MULTI-VITAMINS) TABS Take by mouth.      naproxen (NAPROSYN) 500 MG tablet Take 500 mg by mouth 2 (two) times daily with a meal.     Omega-3 Fatty Acids (FISH OIL) 1000 MG CAPS Take by mouth.     folic acid (FOLVITE) 1 MG tablet Take by mouth. (Patient not taking: Reported on 09/10/2022)     methotrexate (RHEUMATREX) 2.5 MG tablet Take by mouth. (Patient not taking: Reported on 09/10/2022)     omeprazole (PRILOSEC) 20 MG capsule Take by mouth. (Patient not taking: Reported on 09/10/2022)     No current facility-administered medications for this visit.    PHYSICAL EXAMINATION: ECOG PERFORMANCE STATUS: 0 - Asymptomatic  BP (!) 144/88 (BP Location: Right Arm, Patient Position: Sitting)   Pulse 80  Temp (!) 97.4 F (36.3 C) (Tympanic)   Resp 16   Wt 154 lb 12.8 oz (70.2 kg)   BMI 29.25 kg/m   Filed Weights   09/10/22 1000  Weight: 154 lb 12.8 oz (70.2 kg)    Physical Exam HENT:     Head: Normocephalic and atraumatic.     Mouth/Throat:     Pharynx: No oropharyngeal exudate.  Eyes:     Pupils: Pupils are equal, round, and reactive to light.  Cardiovascular:     Rate and Rhythm: Normal rate and regular rhythm.  Pulmonary:     Effort: Pulmonary effort is normal. No respiratory distress.     Breath sounds: Normal breath sounds. No wheezing.  Abdominal:     General: Bowel sounds are normal. There is no distension.     Palpations: Abdomen is soft. There is no mass.     Tenderness: There is no abdominal tenderness. There is no guarding or rebound.  Musculoskeletal:        General: No tenderness. Normal range of motion.     Cervical back: Normal range of motion and neck supple.  Skin:    General: Skin is warm.     Comments:    Neurological:     Mental Status: She is alert and oriented to person, place, and time.  Psychiatric:        Mood and Affect: Affect normal.      LABORATORY DATA:  I have reviewed the data as listed    Component Value Date/Time   NA 135 09/10/2022 0954   NA 140 08/28/2014 1511   K  4.1 09/10/2022 0954   K 3.6 08/28/2014 1511   CL 98 09/10/2022 0954   CL 104 08/28/2014 1511   CO2 28 09/10/2022 0954   CO2 32 08/28/2014 1511   GLUCOSE 109 (H) 09/10/2022 0954   GLUCOSE 99 08/28/2014 1511   BUN 18 09/10/2022 0954   BUN 16 08/28/2014 1511   CREATININE 0.59 09/10/2022 0954   CREATININE 0.66 08/28/2014 1511   CALCIUM 9.4 09/10/2022 0954   CALCIUM 9.3 08/28/2014 1511   PROT 7.2 09/10/2022 0954   PROT 7.2 08/28/2014 1511   ALBUMIN 4.4 09/10/2022 0954   ALBUMIN 4.0 08/28/2014 1511   AST 22 09/10/2022 0954   AST 20 08/28/2014 1511   ALT 15 09/10/2022 0954   ALT 29 08/28/2014 1511   ALKPHOS 114 09/10/2022 0954   ALKPHOS 116 08/28/2014 1511   BILITOT 0.8 09/10/2022 0954   BILITOT 0.4 08/28/2014 1511   GFRNONAA >60 09/10/2022 0954   GFRNONAA >60 08/28/2014 1511   GFRNONAA >60 02/20/2014 1518   GFRAA >60 09/05/2019 1015   GFRAA >60 08/28/2014 1511   GFRAA >60 02/20/2014 1518    No results found for: "SPEP", "UPEP"  Lab Results  Component Value Date   WBC 5.5 09/10/2022   NEUTROABS 2.8 09/10/2022   HGB 12.4 09/10/2022   HCT 36.1 09/10/2022   MCV 78.8 (L) 09/10/2022   PLT 185 09/10/2022      Chemistry      Component Value Date/Time   NA 135 09/10/2022 0954   NA 140 08/28/2014 1511   K 4.1 09/10/2022 0954   K 3.6 08/28/2014 1511   CL 98 09/10/2022 0954   CL 104 08/28/2014 1511   CO2 28 09/10/2022 0954   CO2 32 08/28/2014 1511   BUN 18 09/10/2022 0954   BUN 16 08/28/2014 1511   CREATININE 0.59 09/10/2022 0954   CREATININE 0.66  08/28/2014 1511      Component Value Date/Time   CALCIUM 9.4 09/10/2022 0954   CALCIUM 9.3 08/28/2014 1511   ALKPHOS 114 09/10/2022 0954   ALKPHOS 116 08/28/2014 1511   AST 22 09/10/2022 0954   AST 20 08/28/2014 1511   ALT 15 09/10/2022 0954   ALT 29 08/28/2014 1511   BILITOT 0.8 09/10/2022 0954   BILITOT 0.4 08/28/2014 1511       RADIOGRAPHIC STUDIES: I have personally reviewed the radiological images as listed  and agreed with the findings in the report. No results found.   ASSESSMENT & PLAN:  Carcinoma of overlapping sites of left breast in female, estrogen receptor negative ([place]) # Stage II ER negative HER-2/neu negative left breast cancer status post chemotherapy [2012]; radiation.  November 2023 -screening mammogram: Mild abnormality noted on the right breast.  DEC 2023 Right - Mammogram-diagnostic negative for any concerns.   Again reviewed that patient is essentially cured of her breast cancer.  However need to continue surveillance of new breast cancers.  # OCT 2021 BMD [PCP]- OSTEOPENIA-  Pt wants holistic approach; will wait to repeat BMD with PCP.   # Rheumatoid arthritis [Shoulders- Dr.Patel]- ok to take from Oncology stand point.   # Vaccination: s/p flu shot; discussed regarding COVID vaccination.   # DISPOSITION:  # follow up in 12 months/ labs-cbc/cmp/ca-27-29/ BILATERAL screening Mammogram prior- Dr.B  Cc; Ms.McLaughlin.    Orders Placed This Encounter  Procedures   MM 3D SCREEN BREAST BILATERAL    Standing Status:   Future    Standing Expiration Date:   09/11/2023    Order Specific Question:   Reason for Exam (SYMPTOM  OR DIAGNOSIS REQUIRED)    Answer:   hx br cancer/screening    Order Specific Question:   Preferred imaging location?    Answer:   [facility]   CBC with Differential/Platelet    Standing Status:   Future    Standing Expiration Date:   09/11/2023   Comprehensive metabolic panel    Standing Status:   Future    Standing Expiration Date:   09/11/2023   Cancer antigen 27.29    Standing Status:   Future    Standing Expiration Date:   09/11/2023   All questions were answered. The patient knows to call the clinic with any problems, questions or concerns.      Cammie Sickle, MD 09/10/2022 10:52 AM

## 2022-09-10 NOTE — Progress Notes (Signed)
Now being followed by rheumatologist for rheumatoid arthritis.  Has started Hydroxychloroquine for a few months but would like to discuss methotrexate before starting.

## 2022-09-11 LAB — CANCER ANTIGEN 27.29: CA 27.29: 12.7 U/mL (ref 0.0–38.6)

## 2022-09-21 ENCOUNTER — Ambulatory Visit
Admission: RE | Admit: 2022-09-21 | Discharge: 2022-09-21 | Disposition: A | Discharge status: Home / Self Care | Payer: PPO | Source: Ambulatory Visit | Attending: Rheumatology | Admitting: Rheumatology

## 2022-09-21 DIAGNOSIS — M75102 Unspecified rotator cuff tear or rupture of left shoulder, not specified as traumatic: Secondary | ICD-10-CM | POA: Diagnosis not present

## 2022-09-21 DIAGNOSIS — M25412 Effusion, left shoulder: Secondary | ICD-10-CM | POA: Diagnosis not present

## 2022-09-21 DIAGNOSIS — M159 Polyosteoarthritis, unspecified: Secondary | ICD-10-CM

## 2022-09-21 DIAGNOSIS — M19012 Primary osteoarthritis, left shoulder: Secondary | ICD-10-CM | POA: Diagnosis not present

## 2022-09-27 DIAGNOSIS — E538 Deficiency of other specified B group vitamins: Secondary | ICD-10-CM | POA: Diagnosis not present

## 2022-10-11 DIAGNOSIS — Z7960 Long term (current) use of unspecified immunomodulators and immunosuppressants: Secondary | ICD-10-CM | POA: Diagnosis not present

## 2022-10-11 DIAGNOSIS — M75102 Unspecified rotator cuff tear or rupture of left shoulder, not specified as traumatic: Secondary | ICD-10-CM | POA: Diagnosis not present

## 2022-10-11 DIAGNOSIS — M159 Polyosteoarthritis, unspecified: Secondary | ICD-10-CM | POA: Diagnosis not present

## 2022-10-11 DIAGNOSIS — M0579 Rheumatoid arthritis with rheumatoid factor of multiple sites without organ or systems involvement: Secondary | ICD-10-CM | POA: Diagnosis not present

## 2022-10-11 DIAGNOSIS — M12812 Other specific arthropathies, not elsewhere classified, left shoulder: Secondary | ICD-10-CM | POA: Diagnosis not present

## 2022-10-21 ENCOUNTER — Encounter: Payer: Self-pay | Admitting: Gastroenterology

## 2022-10-22 ENCOUNTER — Encounter
Admission: RE | Disposition: A | Discharge status: Home / Self Care | Payer: Self-pay | Source: Home / Self Care | Attending: Gastroenterology

## 2022-10-22 ENCOUNTER — Ambulatory Visit
Admission: RE | Admit: 2022-10-22 | Discharge: 2022-10-22 | Disposition: A | Discharge status: Home / Self Care | Payer: PPO | Attending: Gastroenterology | Admitting: Gastroenterology

## 2022-10-22 ENCOUNTER — Ambulatory Visit: Payer: PPO | Admitting: Certified Registered"

## 2022-10-22 ENCOUNTER — Encounter: Payer: Self-pay | Admitting: Gastroenterology

## 2022-10-22 DIAGNOSIS — Z923 Personal history of irradiation: Secondary | ICD-10-CM | POA: Diagnosis not present

## 2022-10-22 DIAGNOSIS — Z1211 Encounter for screening for malignant neoplasm of colon: Secondary | ICD-10-CM | POA: Insufficient documentation

## 2022-10-22 DIAGNOSIS — D128 Benign neoplasm of rectum: Secondary | ICD-10-CM | POA: Insufficient documentation

## 2022-10-22 DIAGNOSIS — Z86010 Personal history of colonic polyps: Secondary | ICD-10-CM | POA: Diagnosis not present

## 2022-10-22 DIAGNOSIS — D123 Benign neoplasm of transverse colon: Secondary | ICD-10-CM | POA: Diagnosis not present

## 2022-10-22 DIAGNOSIS — Z08 Encounter for follow-up examination after completed treatment for malignant neoplasm: Secondary | ICD-10-CM | POA: Diagnosis not present

## 2022-10-22 DIAGNOSIS — E785 Hyperlipidemia, unspecified: Secondary | ICD-10-CM | POA: Insufficient documentation

## 2022-10-22 DIAGNOSIS — K635 Polyp of colon: Secondary | ICD-10-CM | POA: Diagnosis not present

## 2022-10-22 DIAGNOSIS — I10 Essential (primary) hypertension: Secondary | ICD-10-CM | POA: Diagnosis not present

## 2022-10-22 DIAGNOSIS — K5730 Diverticulosis of large intestine without perforation or abscess without bleeding: Secondary | ICD-10-CM | POA: Diagnosis not present

## 2022-10-22 DIAGNOSIS — Z9221 Personal history of antineoplastic chemotherapy: Secondary | ICD-10-CM | POA: Insufficient documentation

## 2022-10-22 DIAGNOSIS — Z853 Personal history of malignant neoplasm of breast: Secondary | ICD-10-CM | POA: Insufficient documentation

## 2022-10-22 DIAGNOSIS — K6289 Other specified diseases of anus and rectum: Secondary | ICD-10-CM | POA: Diagnosis not present

## 2022-10-22 DIAGNOSIS — E7800 Pure hypercholesterolemia, unspecified: Secondary | ICD-10-CM | POA: Diagnosis not present

## 2022-10-22 DIAGNOSIS — K5790 Diverticulosis of intestine, part unspecified, without perforation or abscess without bleeding: Secondary | ICD-10-CM | POA: Diagnosis not present

## 2022-10-22 HISTORY — PX: COLONOSCOPY WITH PROPOFOL: SHX5780

## 2022-10-22 SURGERY — COLONOSCOPY WITH PROPOFOL
Anesthesia: General

## 2022-10-22 MED ORDER — SODIUM CHLORIDE 0.9 % IV SOLN
INTRAVENOUS | Status: DC
  Administered 2022-10-22 (×2): 1000 mL via INTRAVENOUS

## 2022-10-22 MED ORDER — PROPOFOL 10 MG/ML IV BOLUS
INTRAVENOUS | Status: DC | PRN
  Administered 2022-10-22: 100 mg via INTRAVENOUS
  Administered 2022-10-22: 130 ug/kg/min via INTRAVENOUS

## 2022-10-22 MED ORDER — PROPOFOL 10 MG/ML IV BOLUS
INTRAVENOUS | Status: AC
  Filled 2022-10-22: qty 20

## 2022-10-22 MED ORDER — EPHEDRINE SULFATE (PRESSORS) 50 MG/ML IJ SOLN
INTRAMUSCULAR | Status: DC | PRN
  Administered 2022-10-22: 10 mg via INTRAVENOUS

## 2022-10-22 MED ORDER — LIDOCAINE HCL (CARDIAC) PF 100 MG/5ML IV SOSY
PREFILLED_SYRINGE | INTRAVENOUS | Status: DC | PRN
  Administered 2022-10-22: 100 mg via INTRAVENOUS

## 2022-10-22 MED ORDER — PHENYLEPHRINE HCL-NACL 20-0.9 MG/250ML-% IV SOLN
INTRAVENOUS | Status: AC
  Filled 2022-10-22: qty 250

## 2022-10-22 MED ORDER — LIDOCAINE HCL (PF) 2 % IJ SOLN
INTRAMUSCULAR | Status: AC
  Filled 2022-10-22: qty 5

## 2022-10-22 MED ORDER — PHENYLEPHRINE 80 MCG/ML (10ML) SYRINGE FOR IV PUSH (FOR BLOOD PRESSURE SUPPORT)
PREFILLED_SYRINGE | INTRAVENOUS | Status: AC
  Filled 2022-10-22: qty 10

## 2022-10-22 MED ORDER — PHENYLEPHRINE HCL (PRESSORS) 10 MG/ML IV SOLN
INTRAVENOUS | Status: DC | PRN
  Administered 2022-10-22 (×3): 80 ug via INTRAVENOUS

## 2022-10-22 MED ORDER — PROPOFOL 10 MG/ML IV BOLUS
INTRAVENOUS | Status: AC
  Filled 2022-10-22: qty 40

## 2022-10-22 NOTE — Interval H&P Note (Signed)
History and Physical Interval Note: Preprocedure H&P from 10/22/22  was reviewed and there was no interval change after seeing and examining the patient.  Written consent was obtained from the patient after discussion of risks, benefits, and alternatives. Patient has consented to proceed with Colonoscopy with possible intervention   10/22/2022 9:46 AM  Kelly Green  has presented today for surgery, with the diagnosis of personal history colon polyps.  The various methods of treatment have been discussed with the patient and family. After consideration of risks, benefits and other options for treatment, the patient has consented to  Procedure(s): COLONOSCOPY WITH PROPOFOL (N/A) as a surgical intervention.  The patient's history has been reviewed, patient examined, no change in status, stable for surgery.  I have reviewed the patient's chart and labs.  Questions were answered to the patient's satisfaction.     [name]

## 2022-10-22 NOTE — Op Note (Signed)
[facility] Gastroenterology Patient Name: Kelly Green Procedure Date: 10/22/2022 9:29 AM MRN: [id number] Account #: [id number] Date of Birth: 12-Oct-1955 Admit Type: Outpatient Age: 67 Room: [facility] 352 ENDO ROOM 1 Gender: Female Note Status: Finalized Instrument Name: Peds Colonoscope [id number] Procedure:             Colonoscopy Indications:           High risk colon cancer surveillance: Personal history                         of colonic polyps Providers:             [name] DO, DO Medicines:             Monitored Anesthesia Care Complications:         No immediate complications. Estimated blood loss:                         Minimal. Procedure:             Pre-Anesthesia Assessment:                        - Prior to the procedure, a History and Physical was                         performed, and patient medications and allergies were                         reviewed. The patient is competent. The risks and                         benefits of the procedure and the sedation options and                         risks were discussed with the patient. All questions                         were answered and informed consent was obtained.                         Patient identification and proposed procedure were                         verified by the physician, the nurse, the anesthetist                         and the technician in the endoscopy suite. Mental                         Status Examination: alert and oriented. Airway                         Examination: normal oropharyngeal airway and neck                         mobility. Respiratory Examination: clear to                         auscultation. CV Examination: RRR, no murmurs, no S3  or S4. Prophylactic Antibiotics: The patient does not                         require prophylactic antibiotics. Prior                         Anticoagulants: The patient has taken no anticoagulant                          or antiplatelet agents. ASA Grade Assessment: II - A                         patient with mild systemic disease. After reviewing                         the risks and benefits, the patient was deemed in                         satisfactory condition to undergo the procedure. The                         anesthesia plan was to use monitored anesthesia care                         (MAC). Immediately prior to administration of                         medications, the patient was re-assessed for adequacy                         to receive sedatives. The heart rate, respiratory                         rate, oxygen saturations, blood pressure, adequacy of                         pulmonary ventilation, and response to care were                         monitored throughout the procedure. The physical                         status of the patient was re-assessed after the                         procedure.                        After obtaining informed consent, the colonoscope was                         passed under direct vision. Throughout the procedure,                         the patient's blood pressure, pulse, and oxygen                         saturations were monitored continuously. The  Colonoscope was introduced through the anus and                         advanced to the the terminal ileum, with                         identification of the appendiceal orifice and IC                         valve. The colonoscopy was performed without                         difficulty. The patient tolerated the procedure well.                         The quality of the bowel preparation was evaluated                         using the BBPS [facility] Bowel Preparation Scale) with                         scores of: Right Colon = 3, Transverse Colon = 3 and                         Left Colon = 3 (entire mucosa seen well with no                         residual  staining, small fragments of stool or opaque                         liquid). The total BBPS score equals 9. The terminal                         ileum, ileocecal valve, appendiceal orifice, and                         rectum were photographed. Findings:      The perianal and digital rectal examinations were normal. Pertinent       negatives include normal sphincter tone.      Anal papilla(e) were hypertrophied. Estimated blood loss: none.      A few small-mouthed diverticula were found in the recto-sigmoid colon.       Estimated blood loss: none.      Two sessile polyps were found in the rectum and transverse colon. The       polyps were 5 to 6 mm in size. These polyps were removed with a hot       snare. Resection and retrieval were complete. Estimated blood loss was       minimal. To prevent bleeding after the polypectomy, one hemostatic clip       was successfully placed (MR conditional). There was no bleeding at the       end of the procedure. The clip was placed on transverse polypectomy       site. Estimated blood loss was minimal.      Retroflexion in the right colon was performed.      The exam was otherwise without abnormality on direct and retroflexion       views.  The terminal ileum appeared normal. Estimated blood loss: none. Impression:            - Anal papilla(e) were hypertrophied.                        - Diverticulosis in the recto-sigmoid colon.                        - Two 5 to 6 mm polyps in the rectum and in the                         transverse colon, removed with a hot snare. Resected                         and retrieved. Clip (MR conditional) was placed.                        - The examination was otherwise normal on direct and                         retroflexion views.                        - The examined portion of the ileum was normal. Recommendation:        - Patient has a contact number available for                         emergencies. The signs  and symptoms of potential                         delayed complications were discussed with the patient.                         Return to normal activities tomorrow. Written                         discharge instructions were provided to the patient.                        - Discharge patient to home.                        - Resume previous diet.                        - Continue present medications.                        - No aspirin, ibuprofen, naproxen, or other                         non-steroidal anti-inflammatory drugs for 5 days after                         polyp removal.                        - Await pathology results.                        -  Repeat colonoscopy for surveillance based on                         pathology results.                        - Return to referring physician as previously                         scheduled.                        - The findings and recommendations were discussed with                         the patient. Procedure Code(s):     --- Professional ---                        [phone], Colonoscopy, flexible; with removal of                         tumor(s), polyp(s), or other lesion(s) by snare                         technique Diagnosis Code(s):     --- Professional ---                        Z86.010, Personal history of colonic polyps                        K62.89, Other specified diseases of anus and rectum                        D12.8, Benign neoplasm of rectum                        D12.3, Benign neoplasm of transverse colon (hepatic                         flexure or splenic flexure)                        K57.30, Diverticulosis of large intestine without                         perforation or abscess without bleeding CPT copyright 2022 American Medical Association. All rights reserved. The codes documented in this report are preliminary and upon coder review may  be revised to meet current compliance requirements. Attending  Participation:      I personally performed the entire procedure. Volney American, DO [name] DO, DO 10/22/2022 10:32:23 AM This report has been signed electronically. Number of Addenda: 0 Note Initiated On: 10/22/2022 9:29 AM Scope Withdrawal Time: 0 hours 22 minutes 35 seconds  Total Procedure Duration: 0 hours 28 minutes 57 seconds  Estimated Blood Loss:  Estimated blood loss was minimal.      [facility]

## 2022-10-22 NOTE — Transfer of Care (Signed)
Immediate Anesthesia Transfer of Care Note  Patient: ROHAN COSLETT  Procedure(s) Performed: COLONOSCOPY WITH PROPOFOL  Patient Location: PACU  Anesthesia Type:General  Level of Consciousness: awake  Airway & Oxygen Therapy: Patient Spontanous Breathing  Post-op Assessment: Report given to RN and Post -op Vital signs reviewed and stable  Post vital signs: Reviewed and stable  Last Vitals:  Vitals Value Taken Time  BP 91/60 10/22/22 1029  Temp 36.2 C 10/22/22 1026  Pulse 74 10/22/22 1030  Resp 19 10/22/22 1030  SpO2 99 % 10/22/22 1030  Vitals shown include unvalidated device data.  Last Pain:  Vitals:   10/22/22 1026  TempSrc: Temporal  PainSc: Asleep         Complications: No notable events documented.

## 2022-10-22 NOTE — Anesthesia Postprocedure Evaluation (Signed)
Anesthesia Post Note  Patient: Kelly Green  Procedure(s) Performed: COLONOSCOPY WITH PROPOFOL  Patient location during evaluation: PACU Anesthesia Type: General Level of consciousness: awake Pain management: satisfactory to patient Vital Signs Assessment: post-procedure vital signs reviewed and stable Respiratory status: spontaneous breathing and nonlabored ventilation Cardiovascular status: stable Anesthetic complications: no  No notable events documented.   Last Vitals:  Vitals:   10/22/22 1036 10/22/22 1046  BP: 116/60 108/67  Pulse: 77 77  Resp: 15 15  Temp:    SpO2: 100% 100%    Last Pain:  Vitals:   10/22/22 1046  TempSrc:   PainSc: 0-No pain                 VAN STAVEREN,Axzel [place]

## 2022-10-22 NOTE — H&P (Signed)
Pre-Procedure H&P   Patient ID: Kelly Green is a 67 y.o. female.  Gastroenterology Provider: [name], DO  Referring Provider: Paulita Cradle, PA PCP: Paulita Cradle, PA  Date: 10/22/2022  HPI Kelly Green is a 67 y.o. female who presents today for Colonoscopy for surv- phx colon polyps.  Regular bowel movements without blood or melena. Last colonoscopy in May 2018 demonstrating left-sided diverticulosis and otherwise normal.  2012 colonoscopy also normal.  2016 2009 per report had polyps.  Family history of polyps in her daughter, no family history of colon cancer  Most recent work hemoglobin 12.1 MCV 80 platelets 230,000 creatinine 0.6   Past Medical History:  Diagnosis Date   Actinic keratosis    Anemia    iron def.anemia   Breast cancer ([place]) 2011   left breast with lumpectomy, chemo and rad tx   Family history of brain cancer    Family history of kidney cancer    Hyperlipemia    Hypertension    Hyperthyroidism    Personal history of chemotherapy    Personal history of radiation therapy    Scoliosis, adolescent acquired    Vitamin B 12 deficiency     Past Surgical History:  Procedure Laterality Date   APPENDECTOMY     BREAST BIOPSY Left 2011   neg with concordant   BREAST CYST ASPIRATION Bilateral    neg   BREAST LUMPECTOMY Left 2011   with radiation and chemo   COLONOSCOPY WITH PROPOFOL N/A 01/14/2017   Procedure: COLONOSCOPY WITH PROPOFOL;  Surgeon: Lollie Sails, MD;  Location: [facility] ENDOSCOPY;  Service: Endoscopy;  Laterality: N/A;   Thyroid Goiter Removal  35 years agi    Family History Daughter- polyps No other h/o GI disease or malignancy  Review of Systems  Constitutional:  Negative for activity change, appetite change, chills, diaphoresis, fatigue, fever and unexpected weight change.  HENT:  Negative for trouble swallowing and voice change.   Respiratory:  Negative for shortness of breath and wheezing.    Cardiovascular:  Negative for chest pain, palpitations and leg swelling.  Gastrointestinal:  Negative for abdominal distention, abdominal pain, anal bleeding, blood in stool, constipation, diarrhea, nausea, rectal pain and vomiting.  Musculoskeletal:  Negative for arthralgias and myalgias.  Skin:  Negative for color change and pallor.  Neurological:  Negative for dizziness, syncope and weakness.  Psychiatric/Behavioral:  Negative for confusion.   All other systems reviewed and are negative.    Medications No current facility-administered medications on file prior to encounter.   Current Outpatient Medications on File Prior to Encounter  Medication Sig Dispense Refill   amLODipine (NORVASC) 5 MG tablet Take 5 mg by mouth daily.      Cholecalciferol 25 MCG (1000 UT) tablet Take by mouth.     Crisaborole (EUCRISA) 2 % OINT Apply 1 application  topically as directed. Qd to bid aa itchy rash on hands and posterior neck until clear, then prn flares 100 g 11   ferrous gluconate (FERGON) 240 (27 FE) MG tablet Take 240 mg by mouth daily.      hydrochlorothiazide (HYDRODIURIL) 25 MG tablet Take 1 tablet by mouth daily.     hydroxychloroquine (PLAQUENIL) 200 MG tablet Take 200 mg by mouth daily.     losartan (COZAAR) 100 MG tablet Take 1 tablet by mouth daily.     mometasone (ELOCON) 0.1 % cream Apply 1 Application topically as directed. Qd to bid aa itchy rash on neck and hands  for 2 weeks then d/c 45 g 11   Multiple Minerals-Vitamins (CALCIUM & VIT D3 BONE HEALTH PO) Take by mouth.     Multiple Vitamin (MULTI-VITAMINS) TABS Take by mouth.     naproxen (NAPROSYN) 500 MG tablet Take 500 mg by mouth 2 (two) times daily with a meal.     Omega-3 Fatty Acids (FISH OIL) 1000 MG CAPS Take by mouth.     omeprazole (PRILOSEC) 20 MG capsule Take by mouth. (Patient not taking: Reported on 09/10/2022)      Pertinent medications related to GI and procedure were reviewed by me with the patient prior to the  procedure   Current Facility-Administered Medications:    0.9 %  sodium chloride infusion, , Intravenous, Continuous, [name], DO, Last Rate: 20 mL/hr at 10/22/22 0939, 1,000 mL at 10/22/22 0939  sodium chloride 1,000 mL (10/22/22 0939)       Allergies  Allergen Reactions   Septra [Sulfamethoxazole-Trimethoprim] Rash   Allergies were reviewed by me prior to the procedure  Objective   Body mass index is 29.76 kg/m. Vitals:   10/22/22 0934  BP: 118/77  Pulse: 73  Resp: 18  Temp: (!) 97.1 F (36.2 C)  TempSrc: Temporal  SpO2: 97%  Weight: 71.5 kg  Height: 5' 1"$  (1.549 m)     Physical Exam Vitals and nursing note reviewed.  Constitutional:      General: She is not in acute distress.    Appearance: Normal appearance. She is not ill-appearing, toxic-appearing or diaphoretic.  HENT:     Head: Normocephalic and atraumatic.     Nose: Nose normal.     Mouth/Throat:     Mouth: Mucous membranes are moist.     Pharynx: Oropharynx is clear.  Eyes:     General: No scleral icterus.    Extraocular Movements: Extraocular movements intact.  Cardiovascular:     Rate and Rhythm: Normal rate and regular rhythm.     Heart sounds: Normal heart sounds. No murmur heard.    No friction rub. No gallop.  Pulmonary:     Effort: Pulmonary effort is normal. No respiratory distress.     Breath sounds: Normal breath sounds. No wheezing, rhonchi or rales.  Abdominal:     General: Bowel sounds are normal. There is no distension.     Palpations: Abdomen is soft.     Tenderness: There is no abdominal tenderness. There is no guarding or rebound.  Musculoskeletal:     Cervical back: Neck supple.     Right lower leg: No edema.     Left lower leg: No edema.  Skin:    General: Skin is warm and dry.     Coloration: Skin is not jaundiced or pale.  Neurological:     General: No focal deficit present.     Mental Status: She is alert and oriented to person, place, and time. Mental  status is at baseline.  Psychiatric:        Mood and Affect: Mood normal.        Behavior: Behavior normal.        Thought Content: Thought content normal.        Judgment: Judgment normal.      Assessment:  Kelly Green is a 67 y.o. female  who presents today for Colonoscopy for surveillance- phx colon polyps.  Plan:  Colonoscopy with possible intervention today  Colonoscopy with possible biopsy, control of bleeding, polypectomy, and interventions as necessary has been discussed  with the patient/patient representative. Informed consent was obtained from the patient/patient representative after explaining the indication, nature, and risks of the procedure including but not limited to death, bleeding, perforation, missed neoplasm/lesions, cardiorespiratory compromise, and reaction to medications. Opportunity for questions was given and appropriate answers were provided. Patient/patient representative has verbalized understanding is amenable to undergoing the procedure.   [name], DO  [facility] Gastroenterology  Portions of the record may have been created with voice recognition software. Occasional wrong-word or 'sound-a-like' substitutions may have occurred due to the inherent limitations of voice recognition software.  Read the chart carefully and recognize, using context, where substitutions may have occurred.

## 2022-10-22 NOTE — Anesthesia Preprocedure Evaluation (Signed)
Anesthesia Evaluation  Patient identified by MRN, date of birth, ID band Patient awake    Reviewed: Allergy & Precautions, NPO status , Patient's Chart, lab work & pertinent test results  Airway Mallampati: II  TM Distance: >3 FB Neck ROM: Full    Dental  (+) Teeth Intact   Pulmonary neg pulmonary ROS   Pulmonary exam normal breath sounds clear to auscultation       Cardiovascular Exercise Tolerance: Good hypertension, Pt. on medications negative cardio ROS Normal cardiovascular exam Rhythm:Regular Rate:Normal     Neuro/Psych negative neurological ROS  negative psych ROS   GI/Hepatic negative GI ROS, Neg liver ROS,,,  Endo/Other  negative endocrine ROS Hyperthyroidism   Renal/GU negative Renal ROS  negative genitourinary   Musculoskeletal negative musculoskeletal ROS (+)    Abdominal Normal abdominal exam  (+)   Peds negative pediatric ROS (+)  Hematology negative hematology ROS (+) Blood dyscrasia, anemia   Anesthesia Other Findings Past Medical History: No date: Actinic keratosis No date: Anemia     Comment:  iron def.anemia 2011: Breast cancer ([place])     Comment:  left breast with lumpectomy, chemo and rad tx No date: Family history of brain cancer No date: Family history of kidney cancer No date: Hyperlipemia No date: Hypertension No date: Hyperthyroidism No date: Personal history of chemotherapy No date: Personal history of radiation therapy No date: Scoliosis, adolescent acquired No date: Vitamin B 12 deficiency  Past Surgical History: No date: APPENDECTOMY 2011: BREAST BIOPSY; Left     Comment:  neg with concordant No date: BREAST CYST ASPIRATION; Bilateral     Comment:  neg 2011: BREAST LUMPECTOMY; Left     Comment:  with radiation and chemo 01/14/2017: COLONOSCOPY WITH PROPOFOL; N/A     Comment:  Procedure: COLONOSCOPY WITH PROPOFOL;  Surgeon:               Lollie Sails, MD;  Location:  [facility] ENDOSCOPY;                Service: Endoscopy;  Laterality: N/A; 35 years agi: Thyroid Goiter Removal     Reproductive/Obstetrics negative OB ROS                             Anesthesia Physical Anesthesia Plan  ASA: 2  Anesthesia Plan: General   Post-op Pain Management:    Induction: Intravenous  PONV Risk Score and Plan: Propofol infusion and TIVA  Airway Management Planned: Natural Airway  Additional Equipment:   Intra-op Plan:   Post-operative Plan:   Informed Consent: I have reviewed the patients History and Physical, chart, labs and discussed the procedure including the risks, benefits and alternatives for the proposed anesthesia with the patient or authorized representative who has indicated his/her understanding and acceptance.     Dental Advisory Given  Plan Discussed with: CRNA and Surgeon  Anesthesia Plan Comments:        Anesthesia Quick Evaluation

## 2022-10-25 ENCOUNTER — Encounter: Payer: Self-pay | Admitting: Gastroenterology

## 2022-10-25 LAB — SURGICAL PATHOLOGY

## 2022-11-01 DIAGNOSIS — E538 Deficiency of other specified B group vitamins: Secondary | ICD-10-CM | POA: Diagnosis not present

## 2022-11-01 DIAGNOSIS — Z79899 Other long term (current) drug therapy: Secondary | ICD-10-CM | POA: Diagnosis not present

## 2022-12-08 DIAGNOSIS — E538 Deficiency of other specified B group vitamins: Secondary | ICD-10-CM | POA: Diagnosis not present

## 2022-12-23 DIAGNOSIS — I10 Essential (primary) hypertension: Secondary | ICD-10-CM | POA: Diagnosis not present

## 2022-12-23 DIAGNOSIS — E782 Mixed hyperlipidemia: Secondary | ICD-10-CM | POA: Diagnosis not present

## 2022-12-23 DIAGNOSIS — H33001 Unspecified retinal detachment with retinal break, right eye: Secondary | ICD-10-CM | POA: Diagnosis not present

## 2022-12-24 DIAGNOSIS — H25813 Combined forms of age-related cataract, bilateral: Secondary | ICD-10-CM | POA: Diagnosis not present

## 2022-12-24 DIAGNOSIS — H33311 Horseshoe tear of retina without detachment, right eye: Secondary | ICD-10-CM | POA: Diagnosis not present

## 2022-12-24 DIAGNOSIS — H43811 Vitreous degeneration, right eye: Secondary | ICD-10-CM | POA: Diagnosis not present

## 2022-12-30 DIAGNOSIS — R748 Abnormal levels of other serum enzymes: Secondary | ICD-10-CM | POA: Diagnosis not present

## 2022-12-30 DIAGNOSIS — C50812 Malignant neoplasm of overlapping sites of left female breast: Secondary | ICD-10-CM | POA: Diagnosis not present

## 2022-12-30 DIAGNOSIS — E782 Mixed hyperlipidemia: Secondary | ICD-10-CM | POA: Diagnosis not present

## 2022-12-30 DIAGNOSIS — Z0000 Encounter for general adult medical examination without abnormal findings: Secondary | ICD-10-CM | POA: Diagnosis not present

## 2022-12-30 DIAGNOSIS — I10 Essential (primary) hypertension: Secondary | ICD-10-CM | POA: Diagnosis not present

## 2022-12-30 DIAGNOSIS — Z171 Estrogen receptor negative status [ER-]: Secondary | ICD-10-CM | POA: Diagnosis not present

## 2022-12-30 DIAGNOSIS — Z131 Encounter for screening for diabetes mellitus: Secondary | ICD-10-CM | POA: Diagnosis not present

## 2022-12-30 DIAGNOSIS — M8580 Other specified disorders of bone density and structure, unspecified site: Secondary | ICD-10-CM | POA: Diagnosis not present

## 2022-12-30 DIAGNOSIS — G4733 Obstructive sleep apnea (adult) (pediatric): Secondary | ICD-10-CM | POA: Diagnosis not present

## 2023-01-12 DIAGNOSIS — E538 Deficiency of other specified B group vitamins: Secondary | ICD-10-CM | POA: Diagnosis not present

## 2023-01-20 DIAGNOSIS — Z7960 Long term (current) use of unspecified immunomodulators and immunosuppressants: Secondary | ICD-10-CM | POA: Diagnosis not present

## 2023-01-20 DIAGNOSIS — M159 Polyosteoarthritis, unspecified: Secondary | ICD-10-CM | POA: Diagnosis not present

## 2023-01-20 DIAGNOSIS — M0579 Rheumatoid arthritis with rheumatoid factor of multiple sites without organ or systems involvement: Secondary | ICD-10-CM | POA: Diagnosis not present

## 2023-01-26 DIAGNOSIS — H33311 Horseshoe tear of retina without detachment, right eye: Secondary | ICD-10-CM | POA: Diagnosis not present

## 2023-01-27 IMAGING — US US ABDOMEN LIMITED
1 series · 14 of 25 positions shown · non-contrast
Comparison: None Available.

CLINICAL DATA: Elevated alkaline phosphatase

EXAM:
ULTRASOUND ABDOMEN LIMITED RIGHT UPPER QUADRANT

[Series 1: us abdomen limited · 0.20mm/px · 14 of 50 slices shown]
[im 1/50]
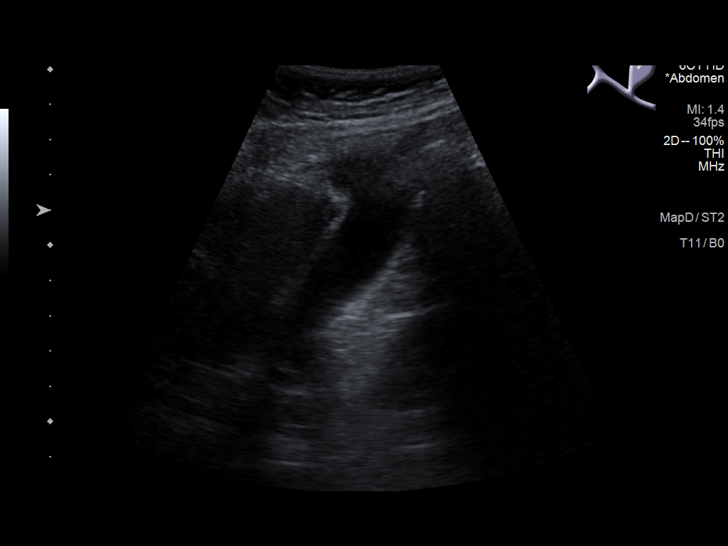
[im 5/50]
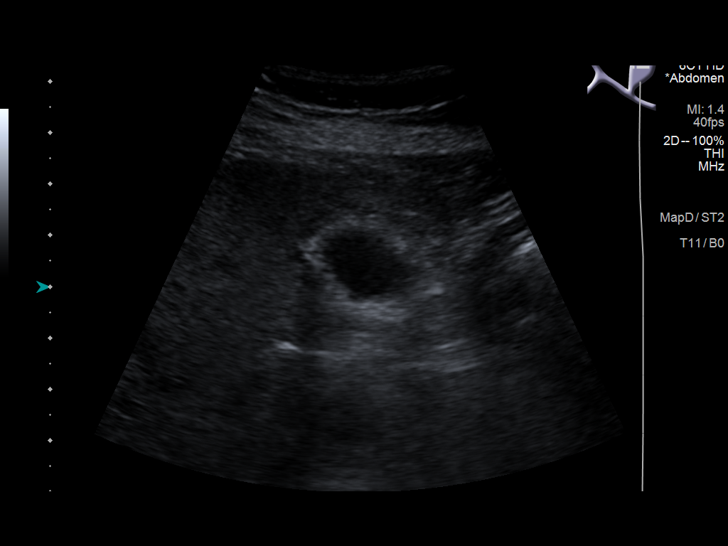
[im 9/50]
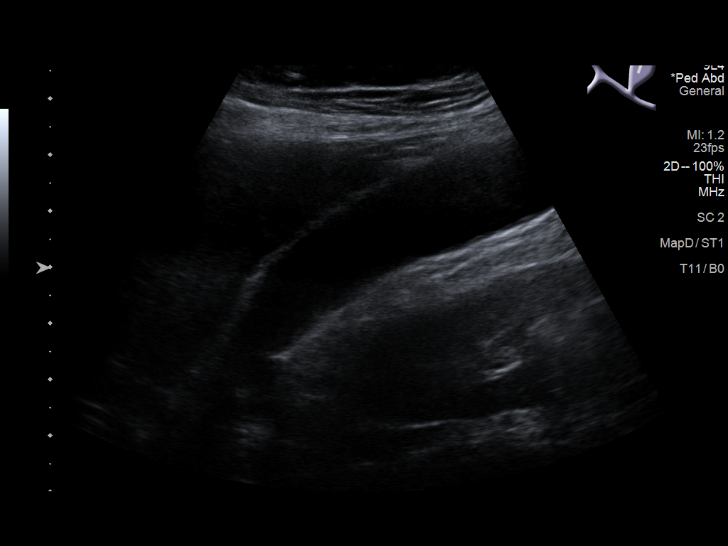
[im 13/50]
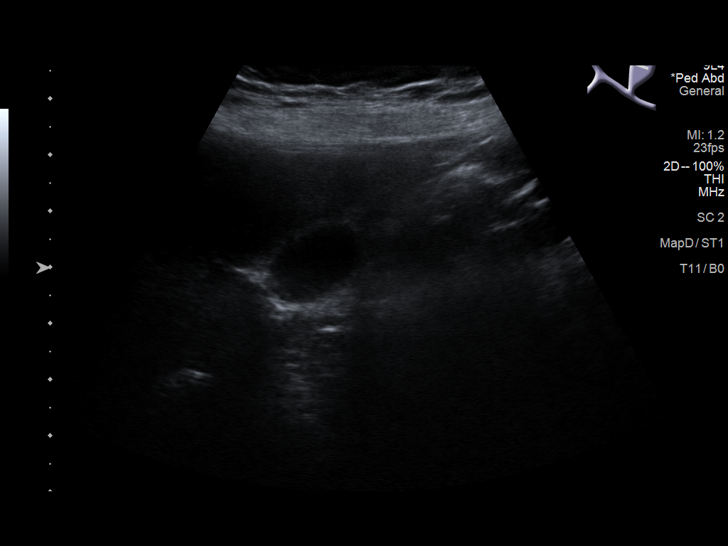
[im 17/50]
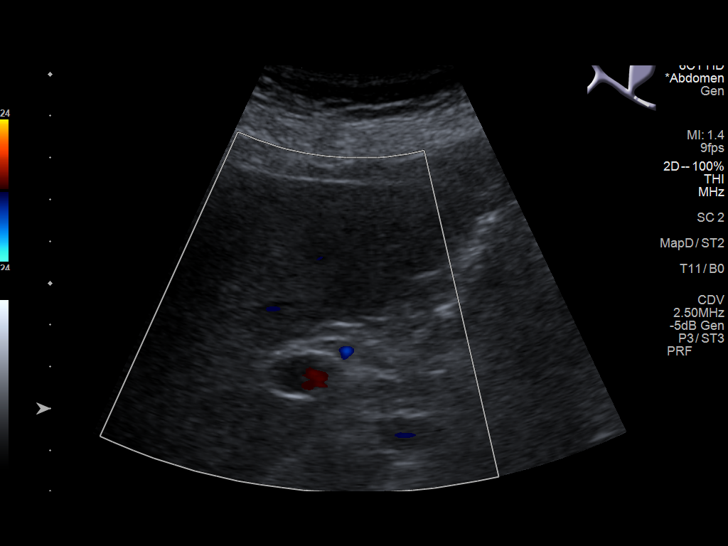
[im 19/50]
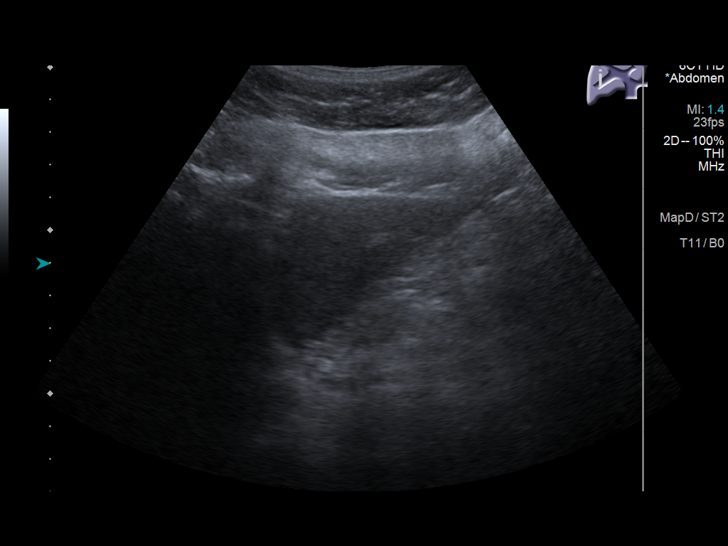
[im 23/50]
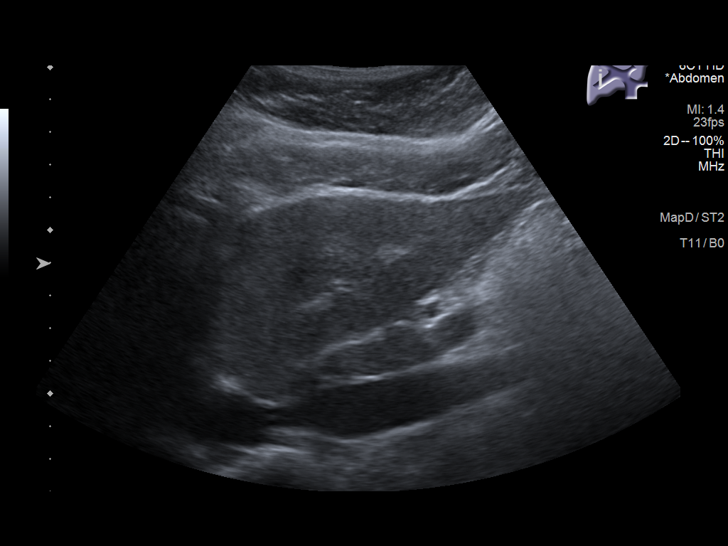
[im 27/50]
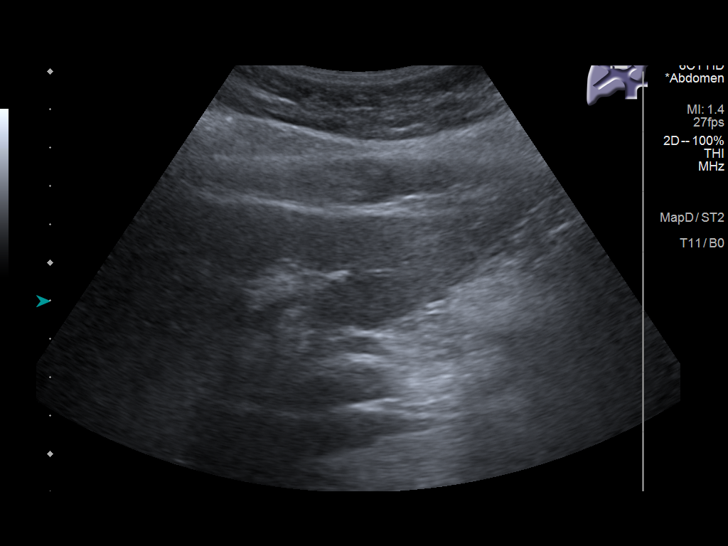
[im 31/50]
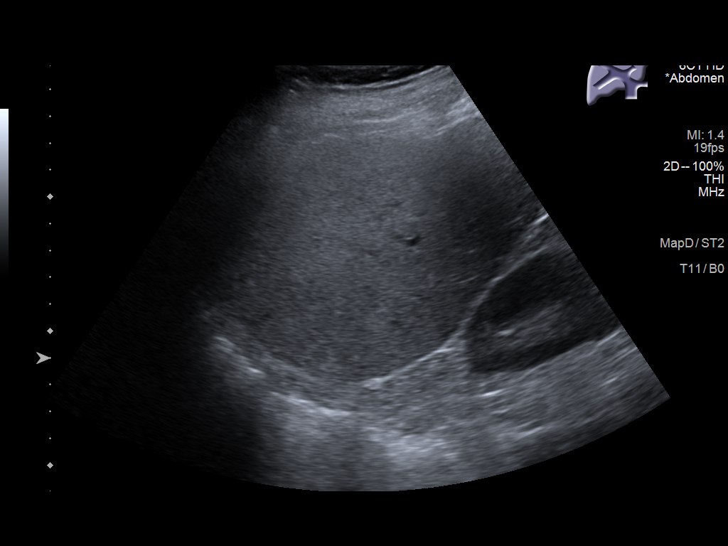
[im 33/50]
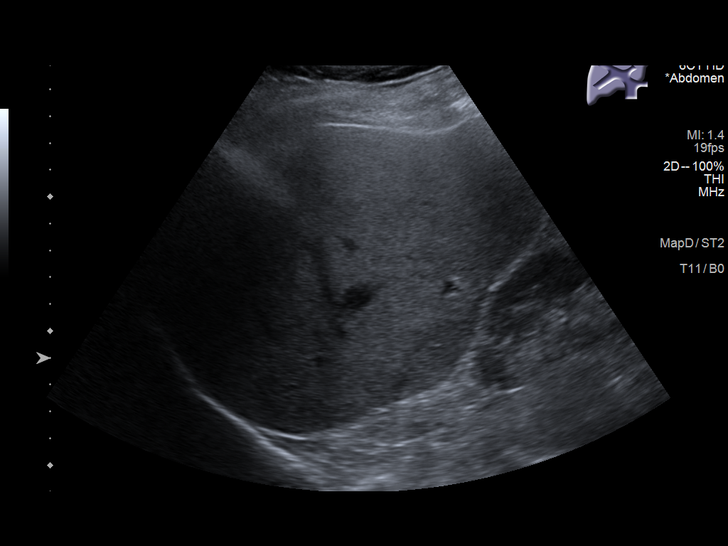
[im 37/50]
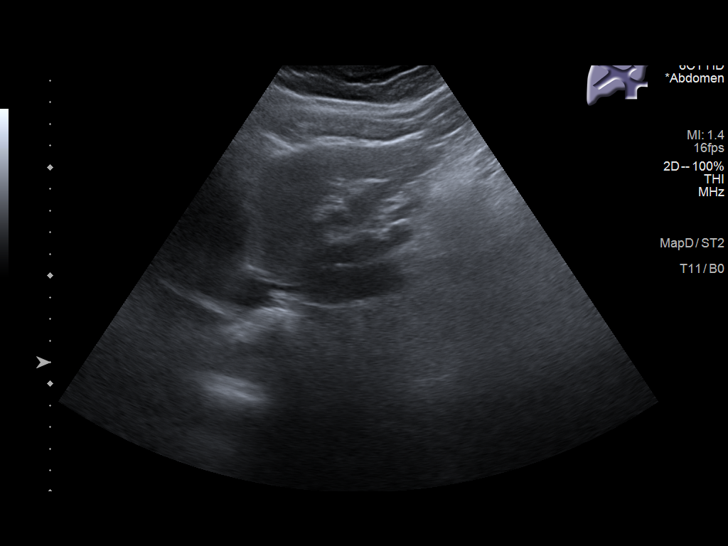
[im 41/50]
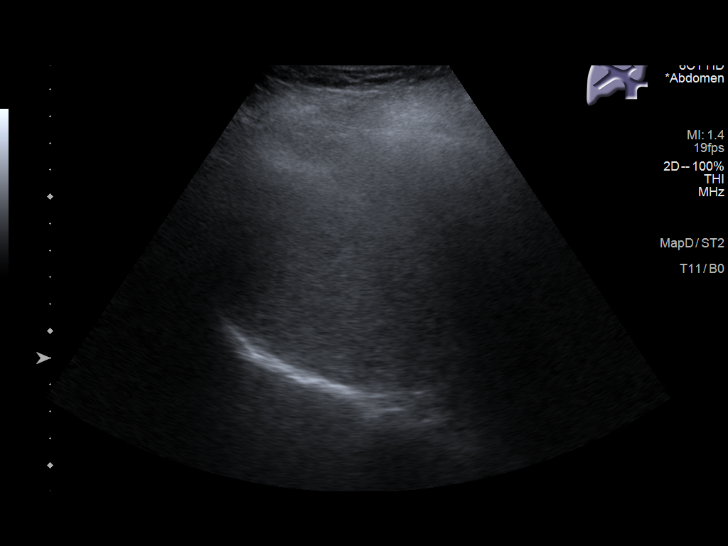
[im 45/50]
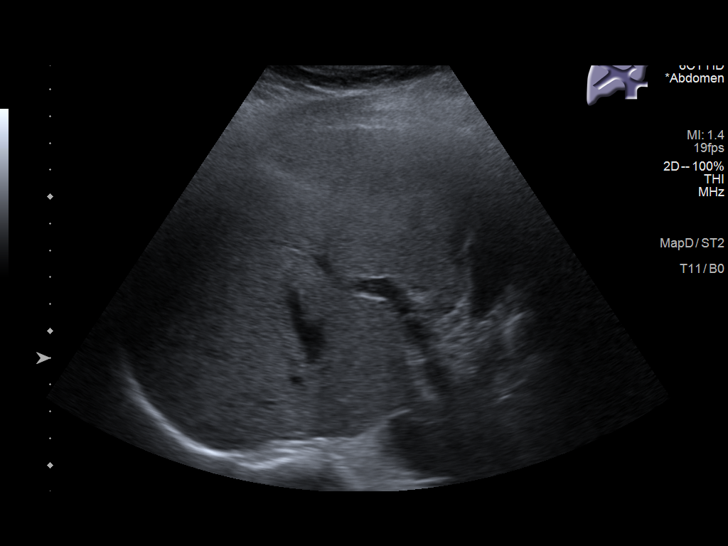
[im 50/50]
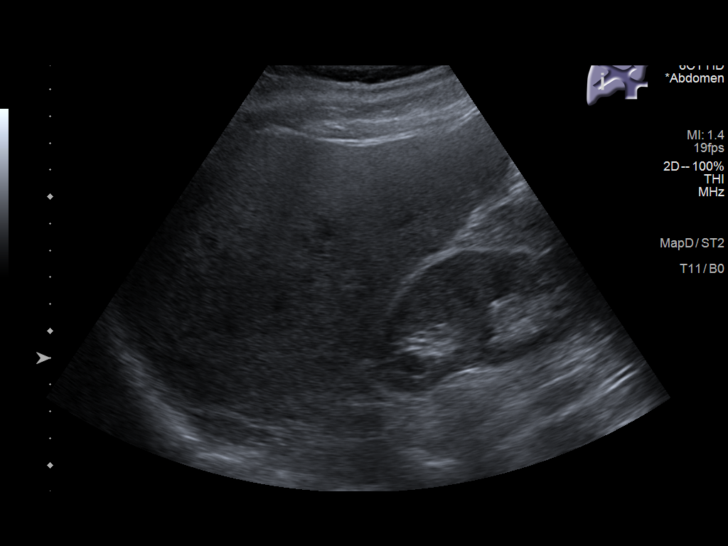

[14 of 25 positions shown; findings below may reference images not displayed]

FINDINGS: Gallbladder:

No gallstones or wall thickening visualized. No sonographic Murphy
sign noted by sonographer.

Common bile duct:

Diameter: 2.4 mm

Liver:

Slight increased echogenicity. No focal mass. Portal vein is patent
on color Doppler imaging with normal direction of blood flow towards
the liver.

Other: None.
IMPRESSION: 1. The gallbladder and common bile duct are normal.
2. Slight increased echogenicity in the liver is nonspecific but
often due to hepatic steatosis.

## 2023-02-16 DIAGNOSIS — M25512 Pain in left shoulder: Secondary | ICD-10-CM | POA: Diagnosis not present

## 2023-02-16 DIAGNOSIS — G8929 Other chronic pain: Secondary | ICD-10-CM | POA: Diagnosis not present

## 2023-02-16 DIAGNOSIS — M19012 Primary osteoarthritis, left shoulder: Secondary | ICD-10-CM | POA: Diagnosis not present

## 2023-02-16 DIAGNOSIS — E538 Deficiency of other specified B group vitamins: Secondary | ICD-10-CM | POA: Diagnosis not present

## 2023-03-30 DIAGNOSIS — E538 Deficiency of other specified B group vitamins: Secondary | ICD-10-CM | POA: Diagnosis not present

## 2023-04-27 DIAGNOSIS — H33311 Horseshoe tear of retina without detachment, right eye: Secondary | ICD-10-CM | POA: Diagnosis not present

## 2023-05-02 DIAGNOSIS — E538 Deficiency of other specified B group vitamins: Secondary | ICD-10-CM | POA: Diagnosis not present

## 2023-06-03 DIAGNOSIS — E538 Deficiency of other specified B group vitamins: Secondary | ICD-10-CM | POA: Diagnosis not present

## 2023-06-03 DIAGNOSIS — Z23 Encounter for immunization: Secondary | ICD-10-CM | POA: Diagnosis not present

## 2023-06-24 DIAGNOSIS — Z131 Encounter for screening for diabetes mellitus: Secondary | ICD-10-CM | POA: Diagnosis not present

## 2023-06-24 DIAGNOSIS — I10 Essential (primary) hypertension: Secondary | ICD-10-CM | POA: Diagnosis not present

## 2023-06-24 DIAGNOSIS — E782 Mixed hyperlipidemia: Secondary | ICD-10-CM | POA: Diagnosis not present

## 2023-07-06 DIAGNOSIS — M8580 Other specified disorders of bone density and structure, unspecified site: Secondary | ICD-10-CM | POA: Diagnosis not present

## 2023-07-06 DIAGNOSIS — Z0000 Encounter for general adult medical examination without abnormal findings: Secondary | ICD-10-CM | POA: Diagnosis not present

## 2023-07-06 DIAGNOSIS — I10 Essential (primary) hypertension: Secondary | ICD-10-CM | POA: Diagnosis not present

## 2023-07-06 DIAGNOSIS — G4733 Obstructive sleep apnea (adult) (pediatric): Secondary | ICD-10-CM | POA: Diagnosis not present

## 2023-07-06 DIAGNOSIS — E782 Mixed hyperlipidemia: Secondary | ICD-10-CM | POA: Diagnosis not present

## 2023-07-06 DIAGNOSIS — Z79899 Other long term (current) drug therapy: Secondary | ICD-10-CM | POA: Diagnosis not present

## 2023-07-06 DIAGNOSIS — Z171 Estrogen receptor negative status [ER-]: Secondary | ICD-10-CM | POA: Diagnosis not present

## 2023-07-06 DIAGNOSIS — M0579 Rheumatoid arthritis with rheumatoid factor of multiple sites without organ or systems involvement: Secondary | ICD-10-CM | POA: Diagnosis not present

## 2023-07-06 DIAGNOSIS — M150 Primary generalized (osteo)arthritis: Secondary | ICD-10-CM | POA: Diagnosis not present

## 2023-07-06 DIAGNOSIS — Z1231 Encounter for screening mammogram for malignant neoplasm of breast: Secondary | ICD-10-CM | POA: Diagnosis not present

## 2023-07-06 DIAGNOSIS — Z23 Encounter for immunization: Secondary | ICD-10-CM | POA: Diagnosis not present

## 2023-07-06 DIAGNOSIS — C50812 Malignant neoplasm of overlapping sites of left female breast: Secondary | ICD-10-CM | POA: Diagnosis not present

## 2023-07-06 DIAGNOSIS — E538 Deficiency of other specified B group vitamins: Secondary | ICD-10-CM | POA: Diagnosis not present

## 2023-07-28 ENCOUNTER — Ambulatory Visit: Payer: PPO | Admitting: Dermatology

## 2023-07-28 DIAGNOSIS — L2089 Other atopic dermatitis: Secondary | ICD-10-CM

## 2023-07-28 DIAGNOSIS — L309 Dermatitis, unspecified: Secondary | ICD-10-CM

## 2023-07-28 DIAGNOSIS — L821 Other seborrheic keratosis: Secondary | ICD-10-CM

## 2023-07-28 DIAGNOSIS — L814 Other melanin hyperpigmentation: Secondary | ICD-10-CM | POA: Diagnosis not present

## 2023-07-28 DIAGNOSIS — D229 Melanocytic nevi, unspecified: Secondary | ICD-10-CM

## 2023-07-28 DIAGNOSIS — Z7189 Other specified counseling: Secondary | ICD-10-CM

## 2023-07-28 DIAGNOSIS — L578 Other skin changes due to chronic exposure to nonionizing radiation: Secondary | ICD-10-CM | POA: Diagnosis not present

## 2023-07-28 DIAGNOSIS — Z79899 Other long term (current) drug therapy: Secondary | ICD-10-CM

## 2023-07-28 DIAGNOSIS — Z1283 Encounter for screening for malignant neoplasm of skin: Secondary | ICD-10-CM

## 2023-07-28 MED ORDER — MOMETASONE FUROATE 0.1 % EX CREA: 1.0000 | TOPICAL_CREAM | CUTANEOUS | 11 refills | Status: AC

## 2023-07-28 NOTE — Patient Instructions (Signed)

## 2023-07-28 NOTE — Progress Notes (Signed)
Follow-Up Visit   Subjective  Kelly Green is a 67 y.o. female who presents for the following: Skin Cancer Screening and Full Body Skin Exam  The patient presents for Total-Body Skin Exam (TBSE) for skin cancer screening and mole check. The patient has spots, moles and lesions to be evaluated, some may be new or changing. She has a history of hand dermatitis and uses mometasone cream as needed. No history of skin cancer.    The following portions of the chart were reviewed this encounter and updated as appropriate: medications, allergies, medical history  Review of Systems:  No other skin or systemic complaints except as noted in HPI or Assessment and Plan.  Objective  Well appearing patient in no apparent distress; mood and affect are within normal limits.  A full examination was performed including scalp, head, eyes, ears, nose, lips, neck, chest, axillae, abdomen, back, buttocks, bilateral upper extremities, bilateral lower extremities, hands, feet, fingers, toes, fingernails, and toenails. All findings within normal limits unless otherwise noted below.   Relevant physical exam findings are noted in the Assessment and Plan.    Assessment & Plan   SKIN CANCER SCREENING PERFORMED TODAY.  ACTINIC DAMAGE - Chronic condition, secondary to cumulative UV/sun exposure - diffuse scaly erythematous macules with underlying dyspigmentation - Recommend daily broad spectrum sunscreen SPF 30+ to sun-exposed areas, reapply every 2 hours as needed.  - Staying in the shade or wearing long sleeves, sun glasses (UVA+UVB protection) and wide brim hats (4-inch brim around the entire circumference of the hat) are also recommended for sun protection.  - Call for new or changing lesions.  LENTIGINES, SEBORRHEIC KERATOSES, HEMANGIOMAS - Benign normal skin lesions - Benign-appearing - Call for any changes  MELANOCYTIC NEVI - Tan-Umble and/or pink-flesh-colored symmetric macules and papules -  Benign appearing on exam today - Observation - Call clinic for new or changing moles - Recommend daily use of broad spectrum spf 30+ sunscreen to sun-exposed areas.    Vascular Birthmark R thigh Vascular birth mark - Benign-appearing.  Observation.  Call clinic for new or changing moles.  Recommend daily use of broad spectrum spf 30+ sunscreen to sun-exposed areas.    HAND DERMATITIS Exam Scaly pink plaques +/- fissures  Chronic condition with duration or expected duration over one year. Currently well-controlled. Hand Dermatitis is a chronic type of eczema that can come and go on the hands and fingers.  While there is no cure, the rash and symptoms can be managed with topical prescription medications, and for more severe cases, with systemic medications.  Recommend mild soap and routine use of moisturizing cream after handwashing.  Minimize soap/water exposure when possible.    Treatment Plan Discussed other topical non-steroid medications - pimecrolimus, tacrolimus, Zoryve.  Eucrisa ointment was too expensive.  Avoid using mometasone cream on a daily basis. If patient is needed to use more frequently, recommend only using mometasone cream 3 times a week and adding a non-steroid cream to be used more often. Patient will call for Rx if needed. Continue mometasone cream every day up to 5d/wk as needed for flares. Avoid applying to face, groin, and axilla. Use as directed. Long-term use can cause thinning of the skin.  Topical steroids (such as triamcinolone, fluocinolone, fluocinonide, mometasone, clobetasol, halobetasol, betamethasone, hydrocortisone) can cause thinning and lightening of the skin if they are used for too long in the same area. Your physician has selected the right strength medicine for your problem and area affected on the body.  Please use your medication only as directed by your physician to prevent side effects.   Eucrisa and mometasone as needed and topical  moisturizers.  Related Medications Crisaborole (EUCRISA) 2 % OINT Apply 1 application  topically as directed. Qd to bid aa itchy rash on hands and posterior neck until clear, then prn flares  mometasone (ELOCON) 0.1 % cream Apply 1 Application topically as directed. Qd to bid aa itchy rash on hands up to 5d/wk as needed. Avoid applying to face, groin, and axilla. Use as directed. Long-term use can cause thinning of the skin.  History of breast cancer Breasts No lymphadenopathy. Continue screening exams.  Return in about 1 year (around 07/27/2024) for TBSE.  Wendee Beavers, CMA, am acting as scribe for Armida Sans, MD .   Documentation: I have reviewed the above documentation for accuracy and completeness, and I agree with the above.  Armida Sans, MD

## 2023-08-06 ENCOUNTER — Encounter: Payer: Self-pay | Admitting: Dermatology

## 2023-08-08 DIAGNOSIS — E538 Deficiency of other specified B group vitamins: Secondary | ICD-10-CM | POA: Diagnosis not present

## 2023-08-15 ENCOUNTER — Ambulatory Visit
Admission: RE | Admit: 2023-08-15 | Discharge: 2023-08-15 | Disposition: A | Discharge status: Home / Self Care | Payer: PPO | Source: Ambulatory Visit | Attending: Internal Medicine | Admitting: Internal Medicine

## 2023-08-15 DIAGNOSIS — Z1231 Encounter for screening mammogram for malignant neoplasm of breast: Secondary | ICD-10-CM | POA: Insufficient documentation

## 2023-08-15 DIAGNOSIS — Z171 Estrogen receptor negative status [ER-]: Secondary | ICD-10-CM

## 2023-08-15 DIAGNOSIS — Z853 Personal history of malignant neoplasm of breast: Secondary | ICD-10-CM | POA: Insufficient documentation

## 2023-09-08 DIAGNOSIS — E538 Deficiency of other specified B group vitamins: Secondary | ICD-10-CM | POA: Diagnosis not present

## 2023-09-12 ENCOUNTER — Other Ambulatory Visit: Payer: PPO

## 2023-09-12 ENCOUNTER — Ambulatory Visit: Payer: PPO | Admitting: Nurse Practitioner

## 2023-09-20 ENCOUNTER — Other Ambulatory Visit: Payer: PPO

## 2023-09-20 ENCOUNTER — Ambulatory Visit: Payer: PPO | Admitting: Nurse Practitioner

## 2023-09-22 ENCOUNTER — Other Ambulatory Visit: Payer: PPO

## 2023-09-22 ENCOUNTER — Ambulatory Visit: Payer: PPO | Admitting: Nurse Practitioner

## 2023-09-22 ENCOUNTER — Other Ambulatory Visit: Payer: Self-pay | Admitting: Nurse Practitioner

## 2023-09-22 DIAGNOSIS — Z171 Estrogen receptor negative status [ER-]: Secondary | ICD-10-CM

## 2023-09-23 ENCOUNTER — Inpatient Hospital Stay (HOSPITAL_BASED_OUTPATIENT_CLINIC_OR_DEPARTMENT_OTHER): Payer: PPO | Admitting: Nurse Practitioner

## 2023-09-23 ENCOUNTER — Encounter: Payer: Self-pay | Admitting: Nurse Practitioner

## 2023-09-23 ENCOUNTER — Inpatient Hospital Stay: Payer: PPO | Attending: Internal Medicine

## 2023-09-23 VITALS — BP 149/80 | HR 73 | Temp 98.2°F | Wt 159.0 lb

## 2023-09-23 DIAGNOSIS — Z853 Personal history of malignant neoplasm of breast: Secondary | ICD-10-CM

## 2023-09-23 DIAGNOSIS — M8580 Other specified disorders of bone density and structure, unspecified site: Secondary | ICD-10-CM | POA: Diagnosis not present

## 2023-09-23 DIAGNOSIS — M069 Rheumatoid arthritis, unspecified: Secondary | ICD-10-CM | POA: Insufficient documentation

## 2023-09-23 DIAGNOSIS — Z08 Encounter for follow-up examination after completed treatment for malignant neoplasm: Secondary | ICD-10-CM | POA: Insufficient documentation

## 2023-09-23 DIAGNOSIS — Z171 Estrogen receptor negative status [ER-]: Secondary | ICD-10-CM

## 2023-09-23 LAB — COMPREHENSIVE METABOLIC PANEL
ALT: 20 U/L (ref 0–44)
AST: 23 U/L (ref 15–41)
Albumin: 4.8 g/dL (ref 3.5–5.0)
Alkaline Phosphatase: 106 U/L (ref 38–126)
Anion gap: 9 (ref 5–15)
BUN: 25 mg/dL — ABNORMAL HIGH (ref 8–23)
CO2: 26 mmol/L (ref 22–32)
Calcium: 9.6 mg/dL (ref 8.9–10.3)
Chloride: 101 mmol/L (ref 98–111)
Creatinine, Ser: 0.52 mg/dL (ref 0.44–1.00)
GFR, Estimated: >60 mL/min (ref >60)
Glucose, Bld: 109 mg/dL — ABNORMAL HIGH (ref 70–99)
Potassium: 3.9 mmol/L (ref 3.5–5.1)
Sodium: 136 mmol/L (ref 135–145)
Total Bilirubin: 1.1 mg/dL (ref 0.0–1.2)
Total Protein: 7.6 g/dL (ref 6.5–8.1)

## 2023-09-23 LAB — CBC WITH DIFFERENTIAL/PLATELET
Abs Immature Granulocytes: 0.02 10*3/uL (ref 0.00–0.07)
Basophils Absolute: 0 10*3/uL (ref 0.0–0.1)
Basophils Relative: 0 %
Eosinophils Absolute: 0.2 10*3/uL (ref 0.0–0.5)
Eosinophils Relative: 4 %
HCT: 39.7 % (ref 36.0–46.0)
Hemoglobin: 13.6 g/dL (ref 12.0–15.0)
Immature Granulocytes: 0 %
Lymphocytes Relative: 37 %
Lymphs Abs: 1.9 10*3/uL (ref 0.7–4.0)
MCH: 28.6 pg (ref 26.0–34.0)
MCHC: 34.3 g/dL (ref 30.0–36.0)
MCV: 83.4 fL (ref 80.0–100.0)
Monocytes Absolute: 0.3 10*3/uL (ref 0.1–1.0)
Monocytes Relative: 6 %
Neutro Abs: 2.7 10*3/uL (ref 1.7–7.7)
Neutrophils Relative %: 53 %
Platelets: 206 10*3/uL (ref 150–400)
RBC: 4.76 MIL/uL (ref 3.87–5.11)
RDW: 12.7 % (ref 11.5–15.5)
WBC: 5.1 10*3/uL (ref 4.0–10.5)
nRBC: 0 % (ref 0.0–0.2)

## 2023-09-23 NOTE — Progress Notes (Signed)
 [facility] OFFICE PROGRESS NOTE  Patient Care Team: Marikay Eva POUR, MD as PCP - General (Physician Assistant) Rennie Cindy SAUNDERS, MD as Consulting Physician (Oncology)   Cancer Staging  No matching staging information was found for the patient.  Oncology History Overview Note  # 2011- stage pII pT2 pN0 ( SN) M0  invasive ductal carcinoma of left breast , ER 0%, PR  1%, her2/neu not overexpressed who is s/p chemotherapy per protocol NSABP 47 Arm 1-A TC X 6 which she was randomized 08/27/10.  She has completed radiation to left breast per Dr. Lenn in June 2012  2.BRCA1 and BRCA2 mutations are not identified (December, 2014)  #Obstructive sleep apnea/CPAP 2019.   DIAGNOSIS: breast cancer  STAGE:   II     ;GOALS: cure  CURRENT/MOST RECENT THERAPY: surveillaince       Carcinoma of overlapping sites of left breast in female, estrogen receptor negative (HCC)  12/05/2019 Genetic Testing   Negative genetic testing on the common hereditary cancer panel. The Common Hereditary Gene Panel offered by [facility] includes sequencing and/or deletion duplication testing of the following 48 genes: APC, ATM, AXIN2, BARD1, BMPR1A, BRCA1, BRCA2, BRIP1, CDH1, CDK4, CDKN2A (p14ARF), CDKN2A (p16INK4a), CHEK2, CTNNA1, DICER1, EPCAM (Deletion/duplication testing only), GREM1 (promoter region deletion/duplication testing only), KIT, MEN1, MLH1, MSH2, MSH3, MSH6, MUTYH, NBN, NF1, NHTL1, PALB2, PDGFRA, PMS2, POLD1, POLE, PTEN, RAD50, RAD51C, RAD51D, RNF43, SDHB, SDHC, SDHD, SMAD4, SMARCA4. STK11, TP53, TSC1, TSC2, and VHL.  The following genes were evaluated for sequence changes only: SDHA and HOXB13 c.251G>A variant only. The report date is December 05, 2019.      INTERVAL HISTORY: Alone.  Ambulating independently.  Kelly Green 68 y.o. female pleasant patient above history of triple negative breast cancer, stage II, currently under surveillance, who returns to clinic for follow up. She  feels at baseline. Continues to have some arthritic pain. Performs self breast exams and denies concerns. No new pain, skin changes. Denies bone pain. Denies shortness of breath or cough. Denies unintentional weight loss.   Review of Systems  Constitutional:  Negative for chills, diaphoresis, fever, malaise/fatigue and weight loss.  HENT:  Negative for nosebleeds and sore throat.   Eyes:  Negative for double vision.  Respiratory:  Negative for cough, hemoptysis, sputum production, shortness of breath and wheezing.   Cardiovascular:  Negative for chest pain, palpitations, orthopnea and leg swelling.  Gastrointestinal:  Negative for abdominal pain, blood in stool, constipation, diarrhea, heartburn, melena, nausea and vomiting.  Genitourinary:  Negative for dysuria, frequency and urgency.  Musculoskeletal:  Positive for back pain and joint pain.  Skin: Negative.  Negative for itching and rash.  Neurological:  Negative for dizziness, tingling, focal weakness, weakness and headaches.  Endo/Heme/Allergies:  Does not bruise/bleed easily.  Psychiatric/Behavioral:  Negative for depression. The patient is not nervous/anxious and does not have insomnia.      PAST MEDICAL HISTORY :  Past Medical History:  Diagnosis Date   Actinic keratosis    Anemia    iron def.anemia   Breast cancer (HCC) 2011   left breast with lumpectomy, chemo and rad tx   Family history of brain cancer    Family history of kidney cancer    Hyperlipemia    Hypertension    Hyperthyroidism    Personal history of chemotherapy    Personal history of radiation therapy    Scoliosis, adolescent acquired    Vitamin B 12 deficiency     PAST SURGICAL HISTORY :  Past Surgical History:  Procedure Laterality Date   APPENDECTOMY     BREAST BIOPSY Left 2011   neg with concordant   BREAST CYST ASPIRATION Bilateral    neg   BREAST LUMPECTOMY Left 2011   with radiation and chemo   COLONOSCOPY WITH PROPOFOL  N/A 01/14/2017    Procedure: COLONOSCOPY WITH PROPOFOL ;  Surgeon: Gaylyn Gladis PENNER, MD;  Location: [facility] ENDOSCOPY;  Service: Endoscopy;  Laterality: N/A;   COLONOSCOPY WITH PROPOFOL  N/A 10/22/2022   Procedure: COLONOSCOPY WITH PROPOFOL ;  Surgeon: Onita Elspeth Sharper, DO;  Location: [facility] ENDOSCOPY;  Service: Gastroenterology;  Laterality: N/A;   Thyroid  Goiter Removal  35 years agi    FAMILY HISTORY :   Family History  Problem Relation Age of Onset   Dementia Mother    Heart attack Father 63   Heart attack Maternal Aunt    Heart attack Maternal Grandmother    Dementia Maternal Grandfather    Brain cancer Cousin 17       pat first cousin   Cirrhosis Cousin        2 pat first cousins   Liver cancer Cousin        pat first cousin   Kidney cancer Cousin        pat first cousin   Breast cancer Cousin 45       mat first cousin's daughter    SOCIAL HISTORY:   Social History   Tobacco Use   Smoking status: Never   Smokeless tobacco: Never  Vaping Use   Vaping status: Never Used  Substance Use Topics   Alcohol use: No   Drug use: No    ALLERGIES:  is allergic to septra [sulfamethoxazole-trimethoprim].  MEDICATIONS:  Current Outpatient Medications  Medication Sig Dispense Refill   amLODipine (NORVASC) 5 MG tablet Take 5 mg by mouth daily.      Cholecalciferol 25 MCG (1000 UT) tablet Take by mouth.     Crisaborole  (EUCRISA ) 2 % OINT Apply 1 application  topically as directed. Qd to bid aa itchy rash on hands and posterior neck until clear, then prn flares 100 g 11   cyanocobalamin (VITAMIN B12) 1000 MCG/ML injection Inject into the muscle.     ferrous gluconate (FERGON) 240 (27 FE) MG tablet Take 240 mg by mouth daily.      hydrochlorothiazide (HYDRODIURIL) 25 MG tablet Take 1 tablet by mouth daily.     hydroxychloroquine (PLAQUENIL) 200 MG tablet Take 200 mg by mouth daily.     losartan (COZAAR) 100 MG tablet Take 1 tablet by mouth daily.     methotrexate (RHEUMATREX) 2.5 MG tablet Take by  mouth.     mometasone  (ELOCON ) 0.1 % cream Apply 1 Application topically as directed. Qd to bid aa itchy rash on hands up to 5d/wk as needed. Avoid applying to face, groin, and axilla. Use as directed. Long-term use can cause thinning of the skin. 45 g 11   Multiple Minerals-Vitamins (CALCIUM & VIT D3 BONE HEALTH PO) Take by mouth.     Multiple Vitamin (MULTI-VITAMINS) TABS Take by mouth.     naproxen (NAPROSYN) 500 MG tablet Take 500 mg by mouth 2 (two) times daily with a meal.     Omega-3 Fatty Acids (FISH OIL) 1000 MG CAPS Take by mouth.     omeprazole (PRILOSEC) 20 MG capsule Take by mouth. (Patient not taking: Reported on 09/10/2022)     No current facility-administered medications for this visit.    PHYSICAL EXAMINATION: ECOG PERFORMANCE  STATUS: 0 - Asymptomatic  BP (!) 149/80 (BP Location: Right Arm, Patient Position: Sitting, Cuff Size: Normal)   Pulse 73   Temp 98.2 F (36.8 C) (Tympanic)   Wt 159 lb (72.1 kg)   SpO2 100%   BMI 30.04 kg/m   Filed Weights   09/23/23 1027  Weight: 159 lb (72.1 kg)    Physical Exam Vitals reviewed.  Constitutional:      Appearance: She is not ill-appearing.  HENT:     Head: Normocephalic and atraumatic.     Mouth/Throat:     Pharynx: No oropharyngeal exudate.  Eyes:     General: No scleral icterus. Cardiovascular:     Rate and Rhythm: Normal rate and regular rhythm.  Pulmonary:     Effort: Pulmonary effort is normal. No respiratory distress.     Breath sounds: No wheezing.  Chest:  Breasts:    Right: No inverted nipple or tenderness.     Left: No inverted nipple or tenderness.     Comments: Breast exam was performed in seated and lying down position. Patient is status post left lumpectomy with a well-healed surgical scar. No evidence of any palpable masses. No evidence of axillary adenopathy. No evidence of any palpable masses or lumps in the right breast. No evidence of right axillary adenopathy Abdominal:     Palpations:  Abdomen is soft.     Tenderness: There is no abdominal tenderness. There is no guarding.  Musculoskeletal:        General: No tenderness. Normal range of motion.     Cervical back: Normal range of motion and neck supple.  Skin:    General: Skin is warm.     Comments:    Neurological:     Mental Status: She is alert and oriented to person, place, and time.  Psychiatric:        Mood and Affect: Mood and affect normal.        Behavior: Behavior normal.     LABORATORY DATA:  I have reviewed the data as listed  Lab Results  Component Value Date   WBC 5.1 09/23/2023   NEUTROABS 2.7 09/23/2023   HGB 13.6 09/23/2023   HCT 39.7 09/23/2023   MCV 83.4 09/23/2023   PLT 206 09/23/2023     Chemistry      Component Value Date/Time   NA 136 09/23/2023 1015   NA 140 08/28/2014 1511   K 3.9 09/23/2023 1015   K 3.6 08/28/2014 1511   CL 101 09/23/2023 1015   CL 104 08/28/2014 1511   CO2 26 09/23/2023 1015   CO2 32 08/28/2014 1511   BUN 25 (H) 09/23/2023 1015   BUN 16 08/28/2014 1511   CREATININE 0.52 09/23/2023 1015   CREATININE 0.66 08/28/2014 1511      Component Value Date/Time   CALCIUM 9.6 09/23/2023 1015   CALCIUM 9.3 08/28/2014 1511   ALKPHOS 106 09/23/2023 1015   ALKPHOS 116 08/28/2014 1511   AST 23 09/23/2023 1015   AST 20 08/28/2014 1511   ALT 20 09/23/2023 1015   ALT 29 08/28/2014 1511   BILITOT 1.1 09/23/2023 1015   BILITOT 0.4 08/28/2014 1511     Component Ref Range & Units (hover) 1 yr ago 2 yr ago 4 yr ago 5 yr ago  CA 27.29 12.7 21.0 CM 25.5 CM 18.7 CM    RADIOGRAPHIC STUDIES: I have personally reviewed the radiological images as listed and agreed with the findings in the report. No results found.  ASSESSMENT & PLAN:   Carcinoma of overlapping sites of left breast in female, estrogen receptor negative  # Stage II ER negative HER-2/neu negative left breast cancer status post chemotherapy [2012]; radiation.  08/2023 mammogram- screening bilatera.  Bi-rads 1: negative. Reviewed survivorship topics and symptoms that would be concerning for recurrence vs new malignancy. She prefers to continue surveillance at the cancer center annually.   # OCT 2021 BMD [PCP]- OSTEOPENIA-  Pt wants holistic approach. Encouraged calcium, vitamin d and weight bearing exercise. Recommend repeating with pcp.    # Rheumatoid arthritis [Shoulders- Dr.Patel]- ok to take from methotrexate from oncology standpoint. Continue follow up with rheumatology.     # Wellness- patient will continue to see her pcp for other cancer screenings and management of her other medical comorbidities.    # DISPOSITION:  December mammogram 12 mo- lab (cbc, cmp, ca27.29), Dr Rennie- [place]  No problem-specific Assessment & Plan notes found for this encounter.  No orders of the defined types were placed in this encounter.  All questions were answered. The patient knows to call the clinic with any problems, questions or concerns.    Kelly KANDICE Dawn, NP 09/23/2023

## 2023-09-25 LAB — CANCER ANTIGEN 27.29: CA 27.29: 21.7 U/mL (ref 0.0–38.6)

## 2023-10-05 ENCOUNTER — Telehealth: Payer: Self-pay | Admitting: Nurse Practitioner

## 2023-10-05 NOTE — Telephone Encounter (Signed)
Called & spoke with patient with results o fca 27.29 which is normal and stable. Follow up as planned.

## 2023-10-10 DIAGNOSIS — E538 Deficiency of other specified B group vitamins: Secondary | ICD-10-CM | POA: Diagnosis not present

## 2023-11-10 DIAGNOSIS — E538 Deficiency of other specified B group vitamins: Secondary | ICD-10-CM | POA: Diagnosis not present

## 2023-11-21 DIAGNOSIS — M0579 Rheumatoid arthritis with rheumatoid factor of multiple sites without organ or systems involvement: Secondary | ICD-10-CM | POA: Diagnosis not present

## 2023-11-21 DIAGNOSIS — Z7960 Long term (current) use of unspecified immunomodulators and immunosuppressants: Secondary | ICD-10-CM | POA: Diagnosis not present

## 2023-11-21 DIAGNOSIS — M150 Primary generalized (osteo)arthritis: Secondary | ICD-10-CM | POA: Diagnosis not present

## 2023-12-28 DIAGNOSIS — I10 Essential (primary) hypertension: Secondary | ICD-10-CM | POA: Diagnosis not present

## 2023-12-28 DIAGNOSIS — E782 Mixed hyperlipidemia: Secondary | ICD-10-CM | POA: Diagnosis not present

## 2023-12-28 DIAGNOSIS — E538 Deficiency of other specified B group vitamins: Secondary | ICD-10-CM | POA: Diagnosis not present

## 2024-01-04 DIAGNOSIS — Z0000 Encounter for general adult medical examination without abnormal findings: Secondary | ICD-10-CM | POA: Diagnosis not present

## 2024-01-04 DIAGNOSIS — E538 Deficiency of other specified B group vitamins: Secondary | ICD-10-CM | POA: Diagnosis not present

## 2024-01-04 DIAGNOSIS — E782 Mixed hyperlipidemia: Secondary | ICD-10-CM | POA: Diagnosis not present

## 2024-01-04 DIAGNOSIS — R748 Abnormal levels of other serum enzymes: Secondary | ICD-10-CM | POA: Diagnosis not present

## 2024-01-04 DIAGNOSIS — M8580 Other specified disorders of bone density and structure, unspecified site: Secondary | ICD-10-CM | POA: Diagnosis not present

## 2024-01-04 DIAGNOSIS — I10 Essential (primary) hypertension: Secondary | ICD-10-CM | POA: Diagnosis not present

## 2024-01-04 DIAGNOSIS — G4733 Obstructive sleep apnea (adult) (pediatric): Secondary | ICD-10-CM | POA: Diagnosis not present

## 2024-01-04 DIAGNOSIS — Z171 Estrogen receptor negative status [ER-]: Secondary | ICD-10-CM | POA: Diagnosis not present

## 2024-01-04 DIAGNOSIS — Z131 Encounter for screening for diabetes mellitus: Secondary | ICD-10-CM | POA: Diagnosis not present

## 2024-01-04 DIAGNOSIS — C50812 Malignant neoplasm of overlapping sites of left female breast: Secondary | ICD-10-CM | POA: Diagnosis not present

## 2024-01-19 DIAGNOSIS — G4733 Obstructive sleep apnea (adult) (pediatric): Secondary | ICD-10-CM | POA: Diagnosis not present

## 2024-01-30 DIAGNOSIS — E538 Deficiency of other specified B group vitamins: Secondary | ICD-10-CM | POA: Diagnosis not present

## 2024-03-02 DIAGNOSIS — E538 Deficiency of other specified B group vitamins: Secondary | ICD-10-CM | POA: Diagnosis not present

## 2024-03-09 DIAGNOSIS — S80812A Abrasion, left lower leg, initial encounter: Secondary | ICD-10-CM | POA: Diagnosis not present

## 2024-03-09 DIAGNOSIS — S80811A Abrasion, right lower leg, initial encounter: Secondary | ICD-10-CM | POA: Diagnosis not present

## 2024-03-09 DIAGNOSIS — W5503XA Scratched by cat, initial encounter: Secondary | ICD-10-CM | POA: Diagnosis not present

## 2024-03-14 DIAGNOSIS — G4733 Obstructive sleep apnea (adult) (pediatric): Secondary | ICD-10-CM | POA: Diagnosis not present

## 2024-03-15 DIAGNOSIS — S8011XD Contusion of right lower leg, subsequent encounter: Secondary | ICD-10-CM | POA: Diagnosis not present

## 2024-03-15 DIAGNOSIS — W5503XD Scratched by cat, subsequent encounter: Secondary | ICD-10-CM | POA: Diagnosis not present

## 2024-03-15 DIAGNOSIS — S71131D Puncture wound without foreign body, right thigh, subsequent encounter: Secondary | ICD-10-CM | POA: Diagnosis not present

## 2024-03-15 DIAGNOSIS — W5501XD Bitten by cat, subsequent encounter: Secondary | ICD-10-CM | POA: Diagnosis not present

## 2024-03-15 NOTE — Progress Notes (Signed)
 Kelly Green is a 68 y.o. female here for acute patient visit to discuss: Chief Complaint  Patient presents with  . Follow-up    Scratched by her cat on 03/09/24, seen by [facility], taking Augmentin.  Still has scratches, bruising and 1 place will bleed occasionally. No pain, some soreness    History of Present Illness:   History of Present Illness Kelly Green is a 68 year old female who presents with a cat bite and scratch injuries.  She was bitten and scratched by her own cat after attempting to intervene in a confrontation between her cat and a stray cat. The incident occurred on Friday, less than a week ago. The bite has multiple punctures, with two specific areas that continue to ooze small amounts of blood when the bandage is removed. The swelling has not significantly changed in size, and the area remains indurated.  She has been taking Augmentin and tolerates it well. No fever or chills, and the redness around the bite is not spreading but rather changing color and moving downward. Her cat is up to date with rabies vaccinations. The scratches are located separately from the bite and are not of concern to her.  The bandage irritates her skin, and she has been removing it, which results in a small drop of blood appearing. She is frustrated about not being able to wear shorts due to the location and appearance of the injury.  Patient Active Problem List  Diagnosis  . Pure hypercholesterolemia  . Essential hypertension, benign  . Iron deficiency anemia  . Vitamin B12 deficiency  . Hyperthyroidism  . Personal history of malignant neoplasm of breast  . Hyperlipidemia, mixed  . Adolescent idiopathic scoliosis of thoracolumbar region  . Carcinoma of overlapping sites of left breast in female, estrogen receptor negative (CMS/HHS-HCC)  . Lumbar degenerative disc disease  . Family history of kidney cancer  . Genetic testing  . Carcinoma of overlapping sites of left breast in female,  estrogen receptor negative (CMS/HHS-HCC)  . Rheumatoid factor positive  . Primary osteoarthritis of left shoulder  . Primary osteoarthritis of right wrist  . DDD (degenerative disc disease), cervical  . Rheumatoid arthritis involving multiple sites with positive rheumatoid factor (CMS/HHS-HCC)     Past Medical History:   Past Medical History:  Diagnosis Date  . Diverticulosis 01/14/2017  . Essential hypertension, benign   . Hyperthyroidism   . Iron deficiency anemia   . Personal history of malignant neoplasm of breast   . Pure hypercholesterolemia   . Rheumatoid arthritis involving multiple sites with positive rheumatoid factor (CMS/HHS-HCC) 08/19/2022  . Vitamin B12 deficiency     Past Surgical History:   Past Surgical History:  Procedure Laterality Date  . COLONOSCOPY N/A 01/15/2005   Dr. D. Sigel @ [facility] - Adenomatous Polyp  . EGD  02/15/2005   Hiatus hernia, gastritis, H. pylori  . COLONOSCOPY N/A 04/19/2008   Dr. EMERSON Mariner @ [facility] - Adenomatous Polyps  . COLONOSCOPY N/A 06/04/2011   Dr. SHAUNNA. Oh @ [facility] - PHPolyps, rpt 5 yrs  . COLONOSCOPY N/A 01/14/2017   Dr. EMERSON Mariner @ [facility] - Diverticulosis, PHPolyps, rpt 5 yrs per [facility]  . Colon @ [facility]  10/22/2022   Tubular adenoma/PHx CP/Repeat 86yrs/SMR  . APPENDECTOMY    . LOBECTOMY PARTIAL THYROID     . MASTECTOMY PARTIAL Left     Allergies:   Allergies  Allergen Reactions  . Septra [Sulfamethoxazole-Trimethoprim] Rash  . Sulfa (Sulfonamide Antibiotics) Rash  Current Medications:   Prior to Admission medications  Medication Sig Taking? Last Dose  amLODIPine (NORVASC) 5 MG tablet Take 1 tablet by mouth once daily Yes Taking  amoxicillin-clavulanate (AUGMENTIN) 875-125 mg tablet Take 1 tablet (875 mg total) by mouth every 12 (twelve) hours for 10 days Yes Taking  crisaborole  (EUCRISA ) 2 % Oint Apply topically Yes Taking  EUCRISA  2 % Oint APPLY 1 APPLICATION TOPICALLY AS DIRECTED UP TO TWICE DAILY. APPLY TO ITCHY RASH  ON HANDS AND POSTERIOR NECK UNTIL CLEAR. THEN APPLY AS NEEDED FOR FLARES. Yes Taking  folic acid (FOLVITE) 1 MG tablet Take 1 tablet (1 mg total) by mouth once daily Yes Taking  hydroCHLOROthiazide (HYDRODIURIL) 25 MG tablet Take 1 tablet by mouth once daily Yes Taking  hydroxychloroquine (PLAQUENIL) 200 mg tablet Take 1 tablet (200 mg total) by mouth once daily Yes Taking  losartan (COZAAR) 100 MG tablet Take 1 tablet by mouth once daily Yes Taking  methotrexate (RHEUMATREX) 2.5 MG tablet Take 5 tablets (12.5 mg total) by mouth every 7 (seven) days All on the same day Yes Taking  mometasone  (ELOCON ) 0.1 % cream APPLY APPLICATION ONCE TO TWICE DAILY TO ITCHY RASH ON NECK AND HANDS FOR TWO WEEKS THEN DISCONTINUE Yes Taking  MULTIVITAMIN ORAL Take 1 tablet by mouth once daily. Yes Taking  naproxen (NAPROSYN) 500 MG tablet Take 1 tablet (500 mg total) by mouth 2 (two) times daily with meals Yes Taking  omega-3 fatty acids/fish oil 340-1,000 mg capsule Take 1 capsule by mouth 2 (two) times daily. Yes Taking  CALCIUM CARBONATE/VITAMIN D3 (CALCIUM 600 + D,3, ORAL) Take 1 capsule by mouth once daily. Patient not taking: Reported on 01/04/2024  Not Taking  ferrous gluconate (FERGON) 240 (27 FE) MG tablet Take 240 mg by mouth once daily. Patient not taking: Reported on 01/04/2024  Not Taking  sodium, potassium, and magnesium (SUPREP) oral solution Take 2 Bottles (1 kit total) by mouth as directed One kit contains 2 bottles.  Take both bottles at the times instructed by your provider. Patient not taking: Reported on 01/04/2024  Not Taking    Family History:   Family History  Problem Relation Name Age of Onset  . Arthritis Mother    . Rheum arthritis Paternal Aunt    . Rheum arthritis Paternal Aunt    . Breast cancer Neg Hx      Social History:   Social History   Socioeconomic History  . Marital status: Married  Occupational History  . Occupation: retired but works 6 hours a week  Tobacco Use   . Smoking status: Never    Passive exposure: Never  . Smokeless tobacco: Never  Vaping Use  . Vaping status: Never Used  Substance and Sexual Activity  . Alcohol use: No    Alcohol/week: 0.0 standard drinks of alcohol  . Drug use: No  . Sexual activity: Yes    Partners: Male  Social History Narrative   The patient is a non-smoker. Occasionally drinks alcohol. 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 Married, 2 children, 2 Grandchildren.   Social Drivers of Corporate investment banker Strain: Low Risk  (07/06/2023)   Overall Financial Resource Strain (CARDIA)   . Difficulty of Paying Living Expenses: Not hard at all  Food Insecurity: No Food Insecurity (07/06/2023)   Hunger Vital Sign   . Worried About Programme researcher, broadcasting/film/video in the Last Year: Never true   . Ran Out of Food in the Last Year: Never true  Transportation Needs: No Transportation Needs (07/06/2023)   PRAPARE - Transportation   . Lack of Transportation (Medical): No   . Lack of Transportation (Non-Medical): No  Housing Stability: Low Risk  (11/21/2023)   Housing Stability Vital Sign   . Unable to Pay for Housing in the Last Year: No   . Number of Times Moved in the Last Year: 0   . Homeless in the Last Year: No    Review of Systems:   Per HPI  Vitals:   Vitals:   03/15/24 1033  BP: 138/82  BP Location: Left upper arm  Patient Position: Sitting  BP Cuff Size: Small Adult  Pulse: 77  SpO2: 98%  Weight: 68.9 kg (152 lb)  Height: 154.9 cm (5' 1)     Body mass index is 28.72 kg/m.  Physical Exam:   Physical Exam SKIN: Multiple puncture wounds with bruising and hematoma. Redness not spreading, bruising spreading downward.   Assessment and Plan:   Assessment & Plan Cat bite with hematoma Hematoma on leg from cat bite, no infection signs, improving. On Augmentin, tolerating well. Cat vaccinated for rabies. - Continue Augmentin. - Monitor hematoma for improvement or infection. - Schedule follow-up next  week or contact if worsens. - Rewrap wound. - Photograph wound for chart.   Eva Crimes, PA-C   Patient received an After Visit Summary

## 2024-03-22 DIAGNOSIS — M150 Primary generalized (osteo)arthritis: Secondary | ICD-10-CM | POA: Diagnosis not present

## 2024-03-22 DIAGNOSIS — W5501XD Bitten by cat, subsequent encounter: Secondary | ICD-10-CM | POA: Diagnosis not present

## 2024-03-22 DIAGNOSIS — M0579 Rheumatoid arthritis with rheumatoid factor of multiple sites without organ or systems involvement: Secondary | ICD-10-CM | POA: Diagnosis not present

## 2024-03-22 DIAGNOSIS — Z7960 Long term (current) use of unspecified immunomodulators and immunosuppressants: Secondary | ICD-10-CM | POA: Diagnosis not present

## 2024-03-27 DIAGNOSIS — W5501XA Bitten by cat, initial encounter: Secondary | ICD-10-CM | POA: Diagnosis not present

## 2024-03-27 DIAGNOSIS — S71151A Open bite, right thigh, initial encounter: Secondary | ICD-10-CM | POA: Diagnosis not present

## 2024-04-03 DIAGNOSIS — E538 Deficiency of other specified B group vitamins: Secondary | ICD-10-CM | POA: Diagnosis not present

## 2024-04-18 DIAGNOSIS — G4733 Obstructive sleep apnea (adult) (pediatric): Secondary | ICD-10-CM | POA: Diagnosis not present

## 2024-04-26 DIAGNOSIS — G4733 Obstructive sleep apnea (adult) (pediatric): Secondary | ICD-10-CM | POA: Diagnosis not present

## 2024-05-09 DIAGNOSIS — E538 Deficiency of other specified B group vitamins: Secondary | ICD-10-CM | POA: Diagnosis not present

## 2024-05-27 DIAGNOSIS — G4733 Obstructive sleep apnea (adult) (pediatric): Secondary | ICD-10-CM | POA: Diagnosis not present

## 2024-06-05 DIAGNOSIS — G4733 Obstructive sleep apnea (adult) (pediatric): Secondary | ICD-10-CM | POA: Diagnosis not present

## 2024-06-13 DIAGNOSIS — E538 Deficiency of other specified B group vitamins: Secondary | ICD-10-CM | POA: Diagnosis not present

## 2024-06-13 DIAGNOSIS — Z23 Encounter for immunization: Secondary | ICD-10-CM | POA: Diagnosis not present

## 2024-07-02 DIAGNOSIS — Z131 Encounter for screening for diabetes mellitus: Secondary | ICD-10-CM | POA: Diagnosis not present

## 2024-07-02 DIAGNOSIS — E782 Mixed hyperlipidemia: Secondary | ICD-10-CM | POA: Diagnosis not present

## 2024-07-02 DIAGNOSIS — I10 Essential (primary) hypertension: Secondary | ICD-10-CM | POA: Diagnosis not present

## 2024-07-09 DIAGNOSIS — E782 Mixed hyperlipidemia: Secondary | ICD-10-CM | POA: Diagnosis not present

## 2024-07-09 DIAGNOSIS — Z1331 Encounter for screening for depression: Secondary | ICD-10-CM | POA: Diagnosis not present

## 2024-07-09 DIAGNOSIS — G4733 Obstructive sleep apnea (adult) (pediatric): Secondary | ICD-10-CM | POA: Diagnosis not present

## 2024-07-09 DIAGNOSIS — Z780 Asymptomatic menopausal state: Secondary | ICD-10-CM | POA: Diagnosis not present

## 2024-07-09 DIAGNOSIS — M8580 Other specified disorders of bone density and structure, unspecified site: Secondary | ICD-10-CM | POA: Diagnosis not present

## 2024-07-09 DIAGNOSIS — I10 Essential (primary) hypertension: Secondary | ICD-10-CM | POA: Diagnosis not present

## 2024-07-09 DIAGNOSIS — C50812 Malignant neoplasm of overlapping sites of left female breast: Secondary | ICD-10-CM | POA: Diagnosis not present

## 2024-07-09 DIAGNOSIS — L600 Ingrowing nail: Secondary | ICD-10-CM | POA: Diagnosis not present

## 2024-07-09 DIAGNOSIS — Z0000 Encounter for general adult medical examination without abnormal findings: Secondary | ICD-10-CM | POA: Diagnosis not present

## 2024-07-09 DIAGNOSIS — M0579 Rheumatoid arthritis with rheumatoid factor of multiple sites without organ or systems involvement: Secondary | ICD-10-CM | POA: Diagnosis not present

## 2024-07-09 DIAGNOSIS — Z1231 Encounter for screening mammogram for malignant neoplasm of breast: Secondary | ICD-10-CM | POA: Diagnosis not present

## 2024-07-09 DIAGNOSIS — R748 Abnormal levels of other serum enzymes: Secondary | ICD-10-CM | POA: Diagnosis not present

## 2024-07-11 DIAGNOSIS — L03032 Cellulitis of left toe: Secondary | ICD-10-CM | POA: Diagnosis not present

## 2024-07-11 DIAGNOSIS — L600 Ingrowing nail: Secondary | ICD-10-CM | POA: Diagnosis not present

## 2024-07-13 NOTE — Progress Notes (Signed)
 Kelly Green                                          MRN: [id number]   07/13/2024   The [facility] Quality Team Specialist reviewed this patient medical record for the purposes of chart review for care gap closure. The following were reviewed: abstraction for care gap closure-controlling blood pressure.    [facility] Quality Team

## 2024-07-16 DIAGNOSIS — E538 Deficiency of other specified B group vitamins: Secondary | ICD-10-CM | POA: Diagnosis not present

## 2024-07-16 DIAGNOSIS — M810 Age-related osteoporosis without current pathological fracture: Secondary | ICD-10-CM | POA: Diagnosis not present

## 2024-07-23 DIAGNOSIS — Z7960 Long term (current) use of unspecified immunomodulators and immunosuppressants: Secondary | ICD-10-CM | POA: Diagnosis not present

## 2024-07-23 DIAGNOSIS — M150 Primary generalized (osteo)arthritis: Secondary | ICD-10-CM | POA: Diagnosis not present

## 2024-07-23 DIAGNOSIS — M0579 Rheumatoid arthritis with rheumatoid factor of multiple sites without organ or systems involvement: Secondary | ICD-10-CM | POA: Diagnosis not present

## 2024-07-26 DIAGNOSIS — L600 Ingrowing nail: Secondary | ICD-10-CM | POA: Diagnosis not present

## 2024-07-26 DIAGNOSIS — L03032 Cellulitis of left toe: Secondary | ICD-10-CM | POA: Diagnosis not present

## 2024-07-27 DIAGNOSIS — G4733 Obstructive sleep apnea (adult) (pediatric): Secondary | ICD-10-CM | POA: Diagnosis not present

## 2024-08-01 ENCOUNTER — Ambulatory Visit: Payer: PPO | Admitting: Dermatology

## 2024-08-01 ENCOUNTER — Encounter: Payer: Self-pay | Admitting: Dermatology

## 2024-08-01 DIAGNOSIS — L810 Postinflammatory hyperpigmentation: Secondary | ICD-10-CM

## 2024-08-01 DIAGNOSIS — W908XXA Exposure to other nonionizing radiation, initial encounter: Secondary | ICD-10-CM | POA: Diagnosis not present

## 2024-08-01 DIAGNOSIS — Z853 Personal history of malignant neoplasm of breast: Secondary | ICD-10-CM

## 2024-08-01 DIAGNOSIS — D229 Melanocytic nevi, unspecified: Secondary | ICD-10-CM

## 2024-08-01 DIAGNOSIS — L2489 Irritant contact dermatitis due to other agents: Secondary | ICD-10-CM | POA: Diagnosis not present

## 2024-08-01 DIAGNOSIS — D1801 Hemangioma of skin and subcutaneous tissue: Secondary | ICD-10-CM | POA: Diagnosis not present

## 2024-08-01 DIAGNOSIS — L814 Other melanin hyperpigmentation: Secondary | ICD-10-CM | POA: Diagnosis not present

## 2024-08-01 DIAGNOSIS — L578 Other skin changes due to chronic exposure to nonionizing radiation: Secondary | ICD-10-CM | POA: Diagnosis not present

## 2024-08-01 DIAGNOSIS — L2089 Other atopic dermatitis: Secondary | ICD-10-CM

## 2024-08-01 DIAGNOSIS — L821 Other seborrheic keratosis: Secondary | ICD-10-CM

## 2024-08-01 DIAGNOSIS — L308 Other specified dermatitis: Secondary | ICD-10-CM | POA: Diagnosis not present

## 2024-08-01 DIAGNOSIS — L819 Disorder of pigmentation, unspecified: Secondary | ICD-10-CM

## 2024-08-01 DIAGNOSIS — Z79899 Other long term (current) drug therapy: Secondary | ICD-10-CM

## 2024-08-01 DIAGNOSIS — Z1283 Encounter for screening for malignant neoplasm of skin: Secondary | ICD-10-CM | POA: Diagnosis not present

## 2024-08-01 DIAGNOSIS — Z7189 Other specified counseling: Secondary | ICD-10-CM

## 2024-08-01 NOTE — Patient Instructions (Addendum)
 Recommend OTC 1% hydrocortisone cream 1-2 times daily to affected areas on face as needed     Recommend daily broad spectrum sunscreen SPF 30+ to sun-exposed areas, reapply every 2 hours as needed. Call for new or changing lesions.  Staying in the shade or wearing long sleeves, sun glasses (UVA+UVB protection) and wide brim hats (4-inch brim around the entire circumference of the hat) are also recommended for sun protection.      Melanoma ABCDEs  Melanoma is the most dangerous type of skin cancer, and is the leading cause of death from skin disease.  You are more likely to develop melanoma if you: Have light-colored skin, light-colored eyes, or red or blond hair Spend a lot of time in the sun Tan regularly, either outdoors or in a tanning bed Have had blistering sunburns, especially during childhood Have a close family member who has had a melanoma Have atypical moles or large birthmarks  Early detection of melanoma is key since treatment is typically straightforward and cure rates are extremely high if we catch it early.   The first sign of melanoma is often a change in a mole or a new dark spot.  The ABCDE system is a way of remembering the signs of melanoma.  A for asymmetry:  The two halves do not match. B for border:  The edges of the growth are irregular. C for color:  A mixture of colors are present instead of an even [place] color. D for diameter:  Melanomas are usually (but not always) greater than 6mm - the size of a pencil eraser. E for evolution:  The spot keeps changing in size, shape, and color.  Please check your skin once per month between visits. You can use a small mirror in front and a large mirror behind you to keep an eye on the back side or your body.   If you see any new or changing lesions before your next follow-up, please call to schedule a visit.  Please continue daily skin protection including broad spectrum sunscreen SPF 30+ to sun-exposed areas,  reapplying every 2 hours as needed when you're outdoors.   Staying in the shade or wearing long sleeves, sun glasses (UVA+UVB protection) and wide brim hats (4-inch brim around the entire circumference of the hat) are also recommended for sun protection.      Due to recent changes in healthcare laws, you may see results of your pathology and/or laboratory studies on MyChart before the doctors have had a chance to review them. We understand that in some cases there may be results that are confusing or concerning to you. Please understand that not all results are received at the same time and often the doctors may need to interpret multiple results in order to provide you with the best plan of care or course of treatment. Therefore, we ask that you please give us  2 business days to thoroughly review all your results before contacting the office for clarification. Should we see a critical lab result, you will be contacted sooner.   If You Need Anything After Your Visit  If you have any questions or concerns for your doctor, please call our main line at [phone] and press option 4 to reach your doctor's medical assistant. If no one answers, please leave a voicemail as directed and we will return your call as soon as possible. Messages left after 4 pm will be answered the following business day.   You may also send us  a message  via MyChart. We typically respond to MyChart messages within 1-2 business days.  For prescription refills, please ask your pharmacy to contact our office. Our fax number is [phone].  If you have an urgent issue when the clinic is closed that cannot wait until the next business day, you can page your doctor at the number below.    Please note that while we do our best to be available for urgent issues outside of office hours, we are not available 24/7.   If you have an urgent issue and are unable to reach us , you may choose to seek medical care at your doctor's office,  retail clinic, urgent care center, or emergency room.  If you have a medical emergency, please immediately call 911 or go to the emergency department.  Pager Numbers  - Dr. Hester: [phone]  - Dr. Jackquline: [phone]  - Dr. Claudene: [phone]   - Dr. Raymund: [phone]  In the event of inclement weather, please call our main line at [phone] for an update on the status of any delays or closures.  Dermatology Medication Tips: Please keep the boxes that topical medications come in in order to help keep track of the instructions about where and how to use these. Pharmacies typically print the medication instructions only on the boxes and not directly on the medication tubes.   If your medication is too expensive, please contact our office at [phone] option 4 or send us  a message through MyChart.   We are unable to tell what your co-pay for medications will be in advance as this is different depending on your insurance coverage. However, we may be able to find a substitute medication at lower cost or fill out paperwork to get insurance to cover a needed medication.   If a prior authorization is required to get your medication covered by your insurance company, please allow us  1-2 business days to complete this process.  Drug prices often vary depending on where the prescription is filled and some pharmacies may offer cheaper prices.  The website www.goodrx.com contains coupons for medications through different pharmacies. The prices here do not account for what the cost may be with help from insurance (it may be cheaper with your insurance), but the website can give you the price if you did not use any insurance.  - You can print the associated coupon and take it with your prescription to the pharmacy.  - You may also stop by our office during regular business hours and pick up a GoodRx coupon card.  - If you need your prescription sent electronically to a different  pharmacy, notify our office through [facility] or by phone at [phone] option 4.     Si Usted Necesita Algo Despus de Su Visita  Tambin puede enviarnos un mensaje a travs de Clinical Cytogeneticist. Por lo general respondemos a los mensajes de MyChart en el transcurso de 1 a 2 das hbiles.  Para renovar recetas, por favor pida a su farmacia que se ponga en contacto con nuestra oficina. Randi lakes de fax es [place] [phone].  Si tiene un asunto urgente cuando la clnica est cerrada y que no puede esperar hasta el siguiente da hbil, puede llamar/localizar a su doctor(a) al nmero que aparece a continuacin.   Por favor, tenga en cuenta que aunque hacemos todo lo posible para estar disponibles para asuntos urgentes fuera del horario de [place], no estamos disponibles las 24 horas del da, los 7 [address]  [phone] de la [place].  Si tiene un problema urgente y no puede comunicarse con nosotros, puede optar por buscar atencin mdica  en el consultorio de su doctor(a), en una clnica privada, en un centro de atencin urgente o en una sala de emergencias.  Si tiene engineer, drilling, por favor llame inmediatamente al 911 o vaya a la sala de emergencias.  Nmeros de bper  - Dr. Hester: [phone]  - Dra. Jackquline: [phone]  - Dr. Claudene: [phone]  - Dra. Kitts: [phone]  En caso de inclemencias del [place], por favor llame a nuestra lnea principal al [phone] para una actualizacin sobre el estado de cualquier retraso o cierre.  Consejos para la medicacin en dermatologa: Por favor, guarde las cajas en las que vienen los medicamentos de uso tpico para ayudarle a seguir las instrucciones sobre dnde y cmo usarlos. Las farmacias generalmente imprimen las instrucciones del medicamento slo en las cajas y no directamente en los tubos del [place].   Si su medicamento es muy caro, por favor, pngase en contacto con landry rieger llamando al [phone] y presione la  opcin 4 o envenos un mensaje a travs de Clinical Cytogeneticist.   No podemos decirle cul ser su copago por los medicamentos por adelantado ya que esto es diferente dependiendo de la cobertura de su seguro. Sin embargo, es posible que podamos encontrar un medicamento sustituto a audiological scientist un formulario para que el seguro cubra el medicamento que se considera necesario.   Si se requiere una autorizacin previa para que su compaa de seguros cubra su medicamento, por favor permtanos de 1 a 2 das hbiles para completar este proceso.  Los precios de los medicamentos varan con frecuencia dependiendo del environmental consultant de dnde se surte la receta y alguna farmacias pueden ofrecer precios ms baratos.  El sitio web www.goodrx.com tiene cupones para medicamentos de health and safety inspector. Los precios aqu no tienen en cuenta lo que podra costar con la ayuda del seguro (puede ser ms barato con su seguro), pero el sitio web puede darle el precio si no utiliz tourist information centre manager.  - Puede imprimir el cupn correspondiente y llevarlo con su receta a la farmacia.  - Tambin puede pasar por nuestra oficina durante el horario de atencin regular y education officer, museum una tarjeta de cupones de GoodRx.  - Si necesita que su receta se enve electrnicamente a una farmacia diferente, informe a nuestra oficina a travs de MyChart de [place] o por telfono llamando al [phone] y presione la opcin 4.

## 2024-08-01 NOTE — Progress Notes (Signed)
 Follow-Up Visit   Subjective  Kelly Green is a 68 y.o. female who presents for the following: Skin Cancer Screening and Full Body Skin Exam. No personal Hx of skin cancer. Hx of AKs.   The patient presents for Total-Body Skin Exam (TBSE) for skin cancer screening and mole check. The patient has spots, moles and lesions to be evaluated, some may be new or changing.     The following portions of the chart were reviewed this encounter and updated as appropriate: medications, allergies, medical history  Review of Systems:  No other skin or systemic complaints except as noted in HPI or Assessment and Plan.  Objective  Well appearing patient in no apparent distress; mood and affect are within normal limits.  A full examination was performed including scalp, head, eyes, ears, nose, lips, neck, chest, axillae, abdomen, back, buttocks, bilateral upper extremities, bilateral lower extremities, hands, feet, fingers, toes, fingernails, and toenails. All findings within normal limits unless otherwise noted below.   Relevant physical exam findings are noted in the Assessment and Plan.     Assessment & Plan   SKIN CANCER SCREENING PERFORMED TODAY.  ACTINIC DAMAGE - Chronic condition, secondary to cumulative UV/sun exposure - diffuse scaly erythematous macules with underlying dyspigmentation - Recommend daily broad spectrum sunscreen SPF 30+ to sun-exposed areas, reapply every 2 hours as needed.  - Staying in the shade or wearing long sleeves, sun glasses (UVA+UVB protection) and wide brim hats (4-inch brim around the entire circumference of the hat) are also recommended for sun protection.  - Call for new or changing lesions.  LENTIGINES, SEBORRHEIC KERATOSES, HEMANGIOMAS - Benign normal skin lesions - Benign-appearing - Call for any changes  MELANOCYTIC NEVI - Tan-Acheampong and/or pink-flesh-colored symmetric macules and papules - Benign appearing on exam today - Observation - Call  clinic for new or changing moles - Recommend daily use of broad spectrum spf 30+ sunscreen to sun-exposed areas.    Vascular Birthmark R thigh Vascular birth mark - Benign-appearing.  Observation.  Call clinic for new or changing moles.  Recommend daily use of broad spectrum spf 30+ sunscreen to sun-exposed areas.    HAND DERMATITIS Exam Scaly pink plaques +/- fissures Chronic condition with duration or expected duration over one year. Currently well-controlled. Hand Dermatitis is a chronic type of eczema that can come and go on the hands and fingers.  While there is no cure, the rash and symptoms can be managed with topical prescription medications, and for more severe cases, with systemic medications.  Recommend mild soap and routine use of moisturizing cream after handwashing.  Minimize soap/water exposure when possible.   Treatment Plan Eucrisa  and mometasone  as needed and topical moisturizers Continue Eucrisa  daily 7 days a week as needed for hand eczema.   Also  mometasone  cream every day up to 5d/wk as needed for flares. Avoid applying to face, groin, and axilla. Use as directed. Long-term use can cause thinning of the skin.  Topical steroids (such as triamcinolone, fluocinolone, fluocinonide, mometasone , clobetasol, halobetasol, betamethasone , hydrocortisone) can cause thinning and lightening of the skin if they are used for too long in the same area. Your physician has selected the right strength medicine for your problem and area affected on the body. Please use your medication only as directed by your physician to prevent side effects.   Related Medications Crisaborole  (EUCRISA ) 2 % OINT Apply 1 application  topically as directed. Qd to bid aa itchy rash on hands and posterior neck until clear,  then prn flares  mometasone  (ELOCON ) 0.1 % cream Apply 1 Application topically as directed. Qd to bid aa itchy rash on hands up to 5d/wk as needed. Avoid applying to face, groin, and  axilla. Use as directed. Long-term use can cause thinning of the skin.  History of breast cancer Breasts No lymphadenopathy. Continue screening exams.  IRRITANT DERMATITIS, secondary to CPAP machine.  Exam: pink patch at right malar cheek Treatment Plan: Recommend OTC 1% hydrocortisone cream 1-2 times daily to affected areas on face as needed    POST-INFLAMMATORY HYPERPIGMENTATION (PIH), secondary to attack from her cat Exam: hyperpigmented macules and/or patches at B/L legs Post-inflammatory hyperpimentation (PIH)  is a benign condition that comes from having previous inflammation in the skin and will fade with time over months to sometimes years. Recommend daily sun protection including sunscreen SPF 30+ to sun-exposed areas. - Recommend treating any itchy or red areas on the skin quickly to prevent new areas of PIH. Once rash has cleared, treating with prescription medicines such as hydroquinone may help fade dark spots faster.  Treatment Plan:  Color may continue to fade over time.  Observe.    Return in about 1 year (around 08/01/2025) for TBSE.  I, Kate Fought, CMA, am acting as scribe for Alm Rhyme, MD.   Documentation: I have reviewed the above documentation for accuracy and completeness, and I agree with the above.  Alm Rhyme, MD

## 2024-08-05 ENCOUNTER — Encounter: Payer: Self-pay | Admitting: Dermatology

## 2024-08-16 DIAGNOSIS — E538 Deficiency of other specified B group vitamins: Secondary | ICD-10-CM | POA: Diagnosis not present

## 2024-08-21 ENCOUNTER — Encounter

## 2024-08-23 ENCOUNTER — Inpatient Hospital Stay: Admission: RE | Admit: 2024-08-23 | Discharge: 2024-08-23 | Attending: Nurse Practitioner

## 2024-08-23 DIAGNOSIS — Z853 Personal history of malignant neoplasm of breast: Secondary | ICD-10-CM

## 2024-09-26 ENCOUNTER — Other Ambulatory Visit: Payer: Self-pay | Admitting: *Deleted

## 2024-09-26 DIAGNOSIS — C50812 Malignant neoplasm of overlapping sites of left female breast: Secondary | ICD-10-CM

## 2024-09-26 DIAGNOSIS — Z853 Personal history of malignant neoplasm of breast: Secondary | ICD-10-CM

## 2024-09-28 ENCOUNTER — Encounter: Payer: Self-pay | Admitting: Internal Medicine

## 2024-09-28 ENCOUNTER — Inpatient Hospital Stay: Payer: PPO | Admitting: Internal Medicine

## 2024-09-28 ENCOUNTER — Inpatient Hospital Stay: Payer: PPO | Attending: Internal Medicine

## 2024-09-28 VITALS — BP 134/85 | HR 91 | Temp 97.6°F | Resp 18 | Ht 61.0 in | Wt 152.6 lb

## 2024-09-28 DIAGNOSIS — C50812 Malignant neoplasm of overlapping sites of left female breast: Secondary | ICD-10-CM

## 2024-09-28 DIAGNOSIS — Z171 Estrogen receptor negative status [ER-]: Secondary | ICD-10-CM

## 2024-09-28 DIAGNOSIS — Z853 Personal history of malignant neoplasm of breast: Secondary | ICD-10-CM

## 2024-09-28 LAB — CBC WITH DIFFERENTIAL (CANCER CENTER ONLY)
Abs Immature Granulocytes: 0.04 K/uL (ref 0.00–0.07)
Basophils Absolute: 0 K/uL (ref 0.0–0.1)
Basophils Relative: 0 %
Eosinophils Absolute: 0.1 K/uL (ref 0.0–0.5)
Eosinophils Relative: 1 %
HCT: 37.2 % (ref 36.0–46.0)
Hemoglobin: 12.7 g/dL (ref 12.0–15.0)
Immature Granulocytes: 1 %
Lymphocytes Relative: 32 %
Lymphs Abs: 2 K/uL (ref 0.7–4.0)
MCH: 28.7 pg (ref 26.0–34.0)
MCHC: 34.1 g/dL (ref 30.0–36.0)
MCV: 84 fL (ref 80.0–100.0)
Monocytes Absolute: 0.4 K/uL (ref 0.1–1.0)
Monocytes Relative: 6 %
Neutro Abs: 3.7 K/uL (ref 1.7–7.7)
Neutrophils Relative %: 60 %
Platelet Count: 270 K/uL (ref 150–400)
RBC: 4.43 MIL/uL (ref 3.87–5.11)
RDW: 12.6 % (ref 11.5–15.5)
WBC Count: 6.2 K/uL (ref 4.0–10.5)
nRBC: 0 % (ref 0.0–0.2)

## 2024-09-28 LAB — CMP (CANCER CENTER ONLY)
ALT: 16 U/L (ref 0–44)
AST: 23 U/L (ref 15–41)
Albumin: 4.7 g/dL (ref 3.5–5.0)
Alkaline Phosphatase: 151 U/L — ABNORMAL HIGH (ref 38–126)
Anion gap: 13 (ref 5–15)
BUN: 12 mg/dL (ref 8–23)
CO2: 24 mmol/L (ref 22–32)
Calcium: 10 mg/dL (ref 8.9–10.3)
Chloride: 98 mmol/L (ref 98–111)
Creatinine: 0.61 mg/dL (ref 0.44–1.00)
GFR, Estimated: >60 mL/min
Glucose, Bld: 102 mg/dL — ABNORMAL HIGH (ref 70–99)
Potassium: 3.5 mmol/L (ref 3.5–5.1)
Sodium: 134 mmol/L — ABNORMAL LOW (ref 135–145)
Total Bilirubin: 0.5 mg/dL (ref 0.0–1.2)
Total Protein: 7.3 g/dL (ref 6.5–8.1)

## 2024-09-28 NOTE — Assessment & Plan Note (Addendum)
#   Stage II ER negative HER-2/neu negative left breast cancer status post chemotherapy [2012]; radiation.     # DEC 2025  - Mammogram-diagnostic negative for any concerns.   Again reviewed that patient is essentially cured of her breast cancer.  However need to continue surveillance of new breast cancers.  # OCT 2025 BMD [PCP]- OSTEOPOROSIS- declined Discussed the potential risk factors for osteoporosis- age/gender/postmenopausal status/use of anti-estrogen treatments. Discussed multiple options including exercise/ calcium and vitamin D supplementation/ and also use of bisphosphonates. Discussed oral bisphosphonates versus parenteral bisphosphonate like Reclast. Discussed the potential benefits and/side effects  Including but not limited to Osteonecrosis of jaw/ hypocalcemia. Pt will talk to PCP.   # Rheumatoid arthritis [Shoulders- Dr.Patel]- stable.  # DISPOSITION:  # DEC 2026- BILATERAL screening Mammogram # follow up in 12 months/ labs-cbc/cmp/ca-27-29/ prior- Dr.B  Cc; Ms.McLaughlin.

## 2024-09-28 NOTE — Progress Notes (Signed)
 [facility] OFFICE PROGRESS NOTE  Patient Care Team: Marikay Eva POUR, PA as PCP - General (Physician Assistant) Rennie Kelly SAUNDERS, MD as Consulting Physician (Oncology)   Cancer Staging  No matching staging information was found for the patient.    Oncology History Overview Note  # 2011- stage pII pT2 pN0 ( SN) M0  invasive ductal carcinoma of left breast , ER 0%, PR  1%, her2/neu not overexpressed who is s/p chemotherapy per protocol NSABP 47 Arm 1-A TC X 6 which she was randomized 08/27/10.  She has completed radiation to left breast per Dr. Lenn in June 2012  2.BRCA1 and BRCA2 mutations are not identified (December, 2014)  #Obstructive sleep apnea/CPAP 2019.   DIAGNOSIS: breast cancer  STAGE:   II     ;GOALS: cure  CURRENT/MOST RECENT THERAPY: surveillaince       Carcinoma of overlapping sites of left breast in female, estrogen receptor negative (HCC)  12/05/2019 Genetic Testing   Negative genetic testing on the common hereditary cancer panel. The Common Hereditary Gene Panel offered by [facility] includes sequencing and/or deletion duplication testing of the following 48 genes: APC, ATM, AXIN2, BARD1, BMPR1A, BRCA1, BRCA2, BRIP1, CDH1, CDK4, CDKN2A (p14ARF), CDKN2A (p16INK4a), CHEK2, CTNNA1, DICER1, EPCAM (Deletion/duplication testing only), GREM1 (promoter region deletion/duplication testing only), KIT, MEN1, MLH1, MSH2, MSH3, MSH6, MUTYH, NBN, NF1, NHTL1, PALB2, PDGFRA, PMS2, POLD1, POLE, PTEN, RAD50, RAD51C, RAD51D, RNF43, SDHB, SDHC, SDHD, SMAD4, SMARCA4. STK11, TP53, TSC1, TSC2, and VHL.  The following genes were evaluated for sequence changes only: SDHA and HOXB13 c.251G>A variant only. The report date is December 05, 2019.       INTERVAL HISTORY: Alone.  Ambulating independently.  Kelly Green 69 y.o.  female pleasant patient above history of triple negative breast cancer stage II currently under surveillance is here for follow-up.  Discussed the  use of AI scribe software for clinical note transcription with the patient, who gave verbal consent to proceed.  History of Present Illness   Kelly Green is a 69 year old female with left breast cancer, status post chemotherapy, who presents for routine oncology surveillance.  She reports no new complaints related to breast cancer. She is currently under surveillance and had a normal mammogram in December 2025. She denies breast symptoms, chest pain, or constitutional symptoms. She reports remaining active through work and daily activities, though she has some difficulty with motivation for regular weight-bearing exercise.  She has osteoporosis, confirmed by bone density testing last year. She is not receiving bisphosphonate or other osteoporosis-specific pharmacotherapy, taking only vitamin supplementation. She previously declined oral bisphosphonates due to concerns about gastrointestinal side effects and is aware of annual infusion options. She reports some difficulty with motivation for regular weight-bearing exercise but remains active through work and daily activities.  She has rheumatoid arthritis and is taking methotrexate and hydroxychloroquine. She experiences mild shoulder pain with preserved mobility and uses acetaminophen as needed. She recently sustained a knee injury with localized infection, which she reports is improving.       Review of Systems  Constitutional:  Negative for chills, diaphoresis, fever, malaise/fatigue and weight loss.  HENT:  Negative for nosebleeds and sore throat.   Eyes:  Negative for double vision.  Respiratory:  Negative for cough, hemoptysis, sputum production, shortness of breath and wheezing.   Cardiovascular:  Negative for chest pain, palpitations, orthopnea and leg swelling.  Gastrointestinal:  Negative for abdominal pain, blood in stool, constipation, diarrhea, heartburn, melena, nausea and vomiting.  Genitourinary:  Negative for dysuria, frequency  and urgency.  Musculoskeletal:  Positive for back pain and joint pain.  Skin: Negative.  Negative for itching and rash.  Neurological:  Negative for dizziness, tingling, focal weakness, weakness and headaches.  Endo/Heme/Allergies:  Does not bruise/bleed easily.  Psychiatric/Behavioral:  Negative for depression. The patient is not nervous/anxious and does not have insomnia.      PAST MEDICAL HISTORY :  Past Medical History:  Diagnosis Date   Actinic keratosis    Anemia    iron def.anemia   Breast cancer (HCC) 2011   left breast with lumpectomy, chemo and rad tx   Family history of brain cancer    Family history of kidney cancer    Hyperlipemia    Hypertension    Hyperthyroidism    Personal history of chemotherapy    Personal history of radiation therapy    Scoliosis, adolescent acquired    Vitamin B 12 deficiency     PAST SURGICAL HISTORY :   Past Surgical History:  Procedure Laterality Date   APPENDECTOMY     BREAST BIOPSY Left 2011   neg with concordant   BREAST CYST ASPIRATION Bilateral    neg   BREAST LUMPECTOMY Left 2011   with radiation and chemo   COLONOSCOPY WITH PROPOFOL  N/A 01/14/2017   Procedure: COLONOSCOPY WITH PROPOFOL ;  Surgeon: Gaylyn Gladis PENNER, MD;  Location: [facility] ENDOSCOPY;  Service: Endoscopy;  Laterality: N/A;   COLONOSCOPY WITH PROPOFOL  N/A 10/22/2022   Procedure: COLONOSCOPY WITH PROPOFOL ;  Surgeon: Onita Elspeth Sharper, DO;  Location: [facility] ENDOSCOPY;  Service: Gastroenterology;  Laterality: N/A;   Thyroid  Goiter Removal  35 years agi    FAMILY HISTORY :   Family History  Problem Relation Age of Onset   Dementia Mother    Heart attack Father 77   Heart attack Maternal Aunt    Heart attack Maternal Grandmother    Dementia Maternal Grandfather    Brain cancer Cousin 17       pat first cousin   Cirrhosis Cousin        2 pat first cousins   Liver cancer Cousin        pat first cousin   Kidney cancer Cousin        pat first cousin   Breast  cancer Cousin 45       mat first cousin's daughter    SOCIAL HISTORY:   Social History   Tobacco Use   Smoking status: Never   Smokeless tobacco: Never  Vaping Use   Vaping status: Never Used  Substance Use Topics   Alcohol use: No   Drug use: No    ALLERGIES:  is allergic to septra [sulfamethoxazole-trimethoprim].  MEDICATIONS:  Current Outpatient Medications  Medication Sig Dispense Refill   amLODipine (NORVASC) 5 MG tablet Take 5 mg by mouth daily.      Cholecalciferol 25 MCG (1000 UT) tablet Take by mouth.     Crisaborole  (EUCRISA ) 2 % OINT Apply 1 application  topically as directed. Qd to bid aa itchy rash on hands and posterior neck until clear, then prn flares 100 g 11   cyanocobalamin (VITAMIN B12) 1000 MCG/ML injection Inject into the muscle.     ferrous gluconate (FERGON) 240 (27 FE) MG tablet Take 240 mg by mouth daily.      hydrochlorothiazide (HYDRODIURIL) 25 MG tablet Take 1 tablet by mouth daily.     hydroxychloroquine (PLAQUENIL) 200 MG tablet Take 200 mg by mouth daily.  losartan (COZAAR) 100 MG tablet Take 1 tablet by mouth daily.     methotrexate (RHEUMATREX) 2.5 MG tablet Take by mouth once a week.     mometasone  (ELOCON ) 0.1 % cream Apply 1 Application topically as directed. Qd to bid aa itchy rash on hands up to 5d/wk as needed. Avoid applying to face, groin, and axilla. Use as directed. Long-term use can cause thinning of the skin. 45 g 11   Multiple Minerals-Vitamins (CALCIUM & VIT D3 BONE HEALTH PO) Take by mouth daily.     naproxen (NAPROSYN) 500 MG tablet Take 500 mg by mouth 2 (two) times daily with a meal.     Omega-3 Fatty Acids (FISH OIL) 1000 MG CAPS Take by mouth.     omeprazole (PRILOSEC) 20 MG capsule Take by mouth. (Patient not taking: Reported on 09/10/2022)     No current facility-administered medications for this visit.    PHYSICAL EXAMINATION: ECOG PERFORMANCE STATUS: 0 - Asymptomatic  BP 134/85 (BP Location: Right Arm, Patient  Position: Sitting, Cuff Size: Normal)   Pulse 91   Temp 97.6 F (36.4 C) (Tympanic)   Resp 18   Ht 5' 1 (1.549 m)   Wt 152 lb 9.6 oz (69.2 kg)   SpO2 100%   BMI 28.83 kg/m   Filed Weights   09/28/24 0946  Weight: 152 lb 9.6 oz (69.2 kg)    Physical Exam HENT:     Head: Normocephalic and atraumatic.     Mouth/Throat:     Pharynx: No oropharyngeal exudate.  Eyes:     Pupils: Pupils are equal, round, and reactive to light.  Cardiovascular:     Rate and Rhythm: Normal rate and regular rhythm.  Pulmonary:     Effort: Pulmonary effort is normal. No respiratory distress.     Breath sounds: Normal breath sounds. No wheezing.  Abdominal:     General: Bowel sounds are normal. There is no distension.     Palpations: Abdomen is soft. There is no mass.     Tenderness: There is no abdominal tenderness. There is no guarding or rebound.  Musculoskeletal:        General: No tenderness. Normal range of motion.     Cervical back: Normal range of motion and neck supple.  Skin:    General: Skin is warm.     Comments:    Neurological:     Mental Status: She is alert and oriented to person, place, and time.  Psychiatric:        Mood and Affect: Affect normal.      LABORATORY DATA:  I have reviewed the data as listed    Component Value Date/Time   NA 134 (L) 09/28/2024 0953   NA 140 08/28/2014 1511   K 3.5 09/28/2024 0953   K 3.6 08/28/2014 1511   CL 98 09/28/2024 0953   CL 104 08/28/2014 1511   CO2 24 09/28/2024 0953   CO2 32 08/28/2014 1511   GLUCOSE 102 (H) 09/28/2024 0953   GLUCOSE 99 08/28/2014 1511   BUN 12 09/28/2024 0953   BUN 16 08/28/2014 1511   CREATININE 0.61 09/28/2024 0953   CREATININE 0.66 08/28/2014 1511   CALCIUM 10.0 09/28/2024 0953   CALCIUM 9.3 08/28/2014 1511   PROT 7.3 09/28/2024 0953   PROT 7.2 08/28/2014 1511   ALBUMIN 4.7 09/28/2024 0953   ALBUMIN 4.0 08/28/2014 1511   AST 23 09/28/2024 0953   ALT 16 09/28/2024 0953   ALT 29 08/28/2014 1511  ALKPHOS 151 (H) 09/28/2024 0953   ALKPHOS 116 08/28/2014 1511   BILITOT 0.5 09/28/2024 0953   GFRNONAA >60 09/28/2024 0953   GFRNONAA >60 08/28/2014 1511   GFRNONAA >60 02/20/2014 1518   GFRAA >60 09/05/2019 1015   GFRAA >60 08/28/2014 1511   GFRAA >60 02/20/2014 1518    No results found for: SPEP, UPEP  Lab Results  Component Value Date   WBC 6.2 09/28/2024   NEUTROABS 3.7 09/28/2024   HGB 12.7 09/28/2024   HCT 37.2 09/28/2024   MCV 84.0 09/28/2024   PLT 270 09/28/2024      Chemistry      Component Value Date/Time   NA 134 (L) 09/28/2024 0953   NA 140 08/28/2014 1511   K 3.5 09/28/2024 0953   K 3.6 08/28/2014 1511   CL 98 09/28/2024 0953   CL 104 08/28/2014 1511   CO2 24 09/28/2024 0953   CO2 32 08/28/2014 1511   BUN 12 09/28/2024 0953   BUN 16 08/28/2014 1511   CREATININE 0.61 09/28/2024 0953   CREATININE 0.66 08/28/2014 1511      Component Value Date/Time   CALCIUM 10.0 09/28/2024 0953   CALCIUM 9.3 08/28/2014 1511   ALKPHOS 151 (H) 09/28/2024 0953   ALKPHOS 116 08/28/2014 1511   AST 23 09/28/2024 0953   ALT 16 09/28/2024 0953   ALT 29 08/28/2014 1511   BILITOT 0.5 09/28/2024 0953       RADIOGRAPHIC STUDIES: I have personally reviewed the radiological images as listed and agreed with the findings in the report. No results found.   ASSESSMENT & PLAN:  Carcinoma of overlapping sites of left breast in female, estrogen receptor negative (HCC) # Stage II ER negative HER-2/neu negative left breast cancer status post chemotherapy [2012]; radiation.     # DEC 2025  - Mammogram-diagnostic negative for any concerns.   Again reviewed that patient is essentially cured of her breast cancer.  However need to continue surveillance of new breast cancers.  # OCT 2025 BMD [PCP]- OSTEOPOROSIS- declined Discussed the potential risk factors for osteoporosis- age/gender/postmenopausal status/use of anti-estrogen treatments. Discussed multiple options including  exercise/ calcium and vitamin D supplementation/ and also use of bisphosphonates. Discussed oral bisphosphonates versus parenteral bisphosphonate like Reclast. Discussed the potential benefits and/side effects  Including but not limited to Osteonecrosis of jaw/ hypocalcemia. Pt will talk to PCP.   # Rheumatoid arthritis [Shoulders- Dr.Patel]- stable.  # DISPOSITION:  # DEC 2026- BILATERAL screening Mammogram # follow up in 12 months/ labs-cbc/cmp/ca-27-29/ prior- Dr.B  Cc; Ms.McLaughlin.     No orders of the defined types were placed in this encounter.  All questions were answered. The patient knows to call the clinic with any problems, questions or concerns.      Kelly JONELLE Joe, MD 09/28/2024 10:59 AM

## 2024-09-28 NOTE — Progress Notes (Signed)
 Pt is on augmentin for knee infection, 2nd round. Unsure of mg.

## 2024-09-28 NOTE — Addendum Note (Signed)
 Addended by: LAEL BROWNING A on: 09/28/2024 11:40 AM   Modules accepted: Orders

## 2024-09-29 LAB — CANCER ANTIGEN 27.29: CA 27.29: 24.8 U/mL (ref 0.0–38.6)

## 2025-08-13 ENCOUNTER — Ambulatory Visit: Admitting: Dermatology

## 2025-09-27 ENCOUNTER — Inpatient Hospital Stay

## 2025-10-04 ENCOUNTER — Inpatient Hospital Stay: Admitting: Internal Medicine
